# Patient Record
Sex: Male | Born: 1975 | Race: Asian | Hispanic: No | Marital: Married | Smoking: Never smoker
Health system: Southern US, Community
[De-identification: ages and names within clinical notes are randomized; demographics above are authoritative.]

## PROBLEM LIST (undated history)

## (undated) DIAGNOSIS — I10 Essential (primary) hypertension: Secondary | ICD-10-CM

## (undated) DIAGNOSIS — R7303 Prediabetes: Secondary | ICD-10-CM

## (undated) DIAGNOSIS — E119 Type 2 diabetes mellitus without complications: Secondary | ICD-10-CM

## (undated) DIAGNOSIS — A159 Respiratory tuberculosis unspecified: Secondary | ICD-10-CM

## (undated) DIAGNOSIS — E785 Hyperlipidemia, unspecified: Secondary | ICD-10-CM

## (undated) HISTORY — DX: Respiratory tuberculosis unspecified: A15.9

## (undated) HISTORY — DX: Essential (primary) hypertension: I10

## (undated) HISTORY — DX: Hyperlipidemia, unspecified: E78.5

## (undated) HISTORY — DX: Prediabetes: R73.03

## (undated) HISTORY — DX: Type 2 diabetes mellitus without complications: E11.9

---

## 1998-07-21 DIAGNOSIS — A159 Respiratory tuberculosis unspecified: Secondary | ICD-10-CM

## 1998-07-21 HISTORY — DX: Respiratory tuberculosis unspecified: A15.9

## 1998-07-21 HISTORY — PX: THORACENTESIS: SHX235

## 2009-06-30 ENCOUNTER — Emergency Department (HOSPITAL_COMMUNITY): Admission: EM | Admit: 2009-06-30 | Discharge: 2009-06-30 | Payer: Self-pay | Admitting: Family Medicine

## 2010-09-17 ENCOUNTER — Other Ambulatory Visit: Payer: Self-pay | Admitting: Infectious Diseases

## 2010-09-17 ENCOUNTER — Ambulatory Visit
Admission: RE | Admit: 2010-09-17 | Discharge: 2010-09-17 | Disposition: A | Discharge status: Home / Self Care | Payer: No Typology Code available for payment source | Source: Ambulatory Visit | Attending: Infectious Diseases | Admitting: Infectious Diseases

## 2010-09-17 DIAGNOSIS — R9389 Abnormal findings on diagnostic imaging of other specified body structures: Secondary | ICD-10-CM

## 2011-04-10 ENCOUNTER — Ambulatory Visit
Admission: RE | Admit: 2011-04-10 | Discharge: 2011-04-10 | Disposition: A | Discharge status: Home / Self Care | Payer: No Typology Code available for payment source | Source: Ambulatory Visit | Attending: Infectious Diseases | Admitting: Infectious Diseases

## 2011-04-10 ENCOUNTER — Other Ambulatory Visit: Payer: Self-pay | Admitting: Infectious Diseases

## 2011-04-10 DIAGNOSIS — R9389 Abnormal findings on diagnostic imaging of other specified body structures: Secondary | ICD-10-CM

## 2012-02-28 IMAGING — CR DG CHEST 2V
2 series · 2 of 2 positions shown · non-contrast
Comparison: 09/17/2010

CLINICAL DATA: Normal chest x-ray.  Past history of tuberculosis 10
years prior

CHEST - 2 VIEW

[view not recorded (1 of 2)]
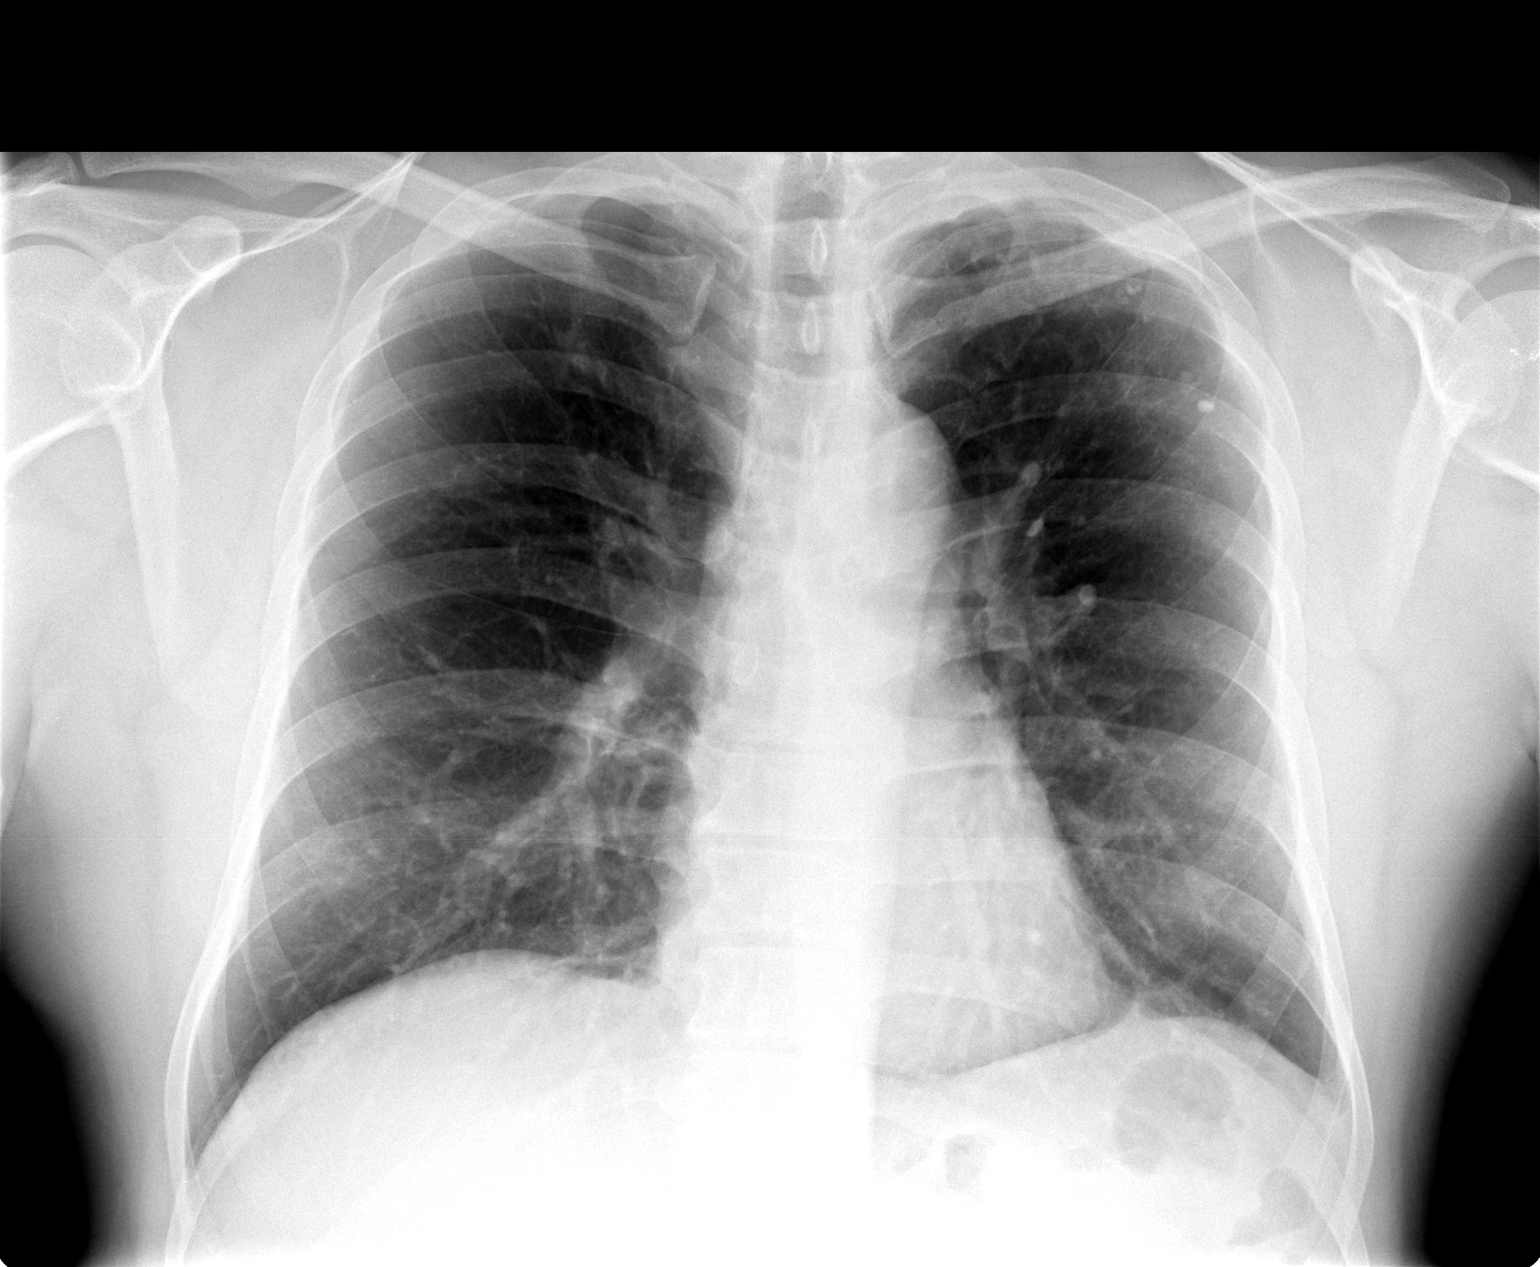

[view not recorded (2 of 2)]
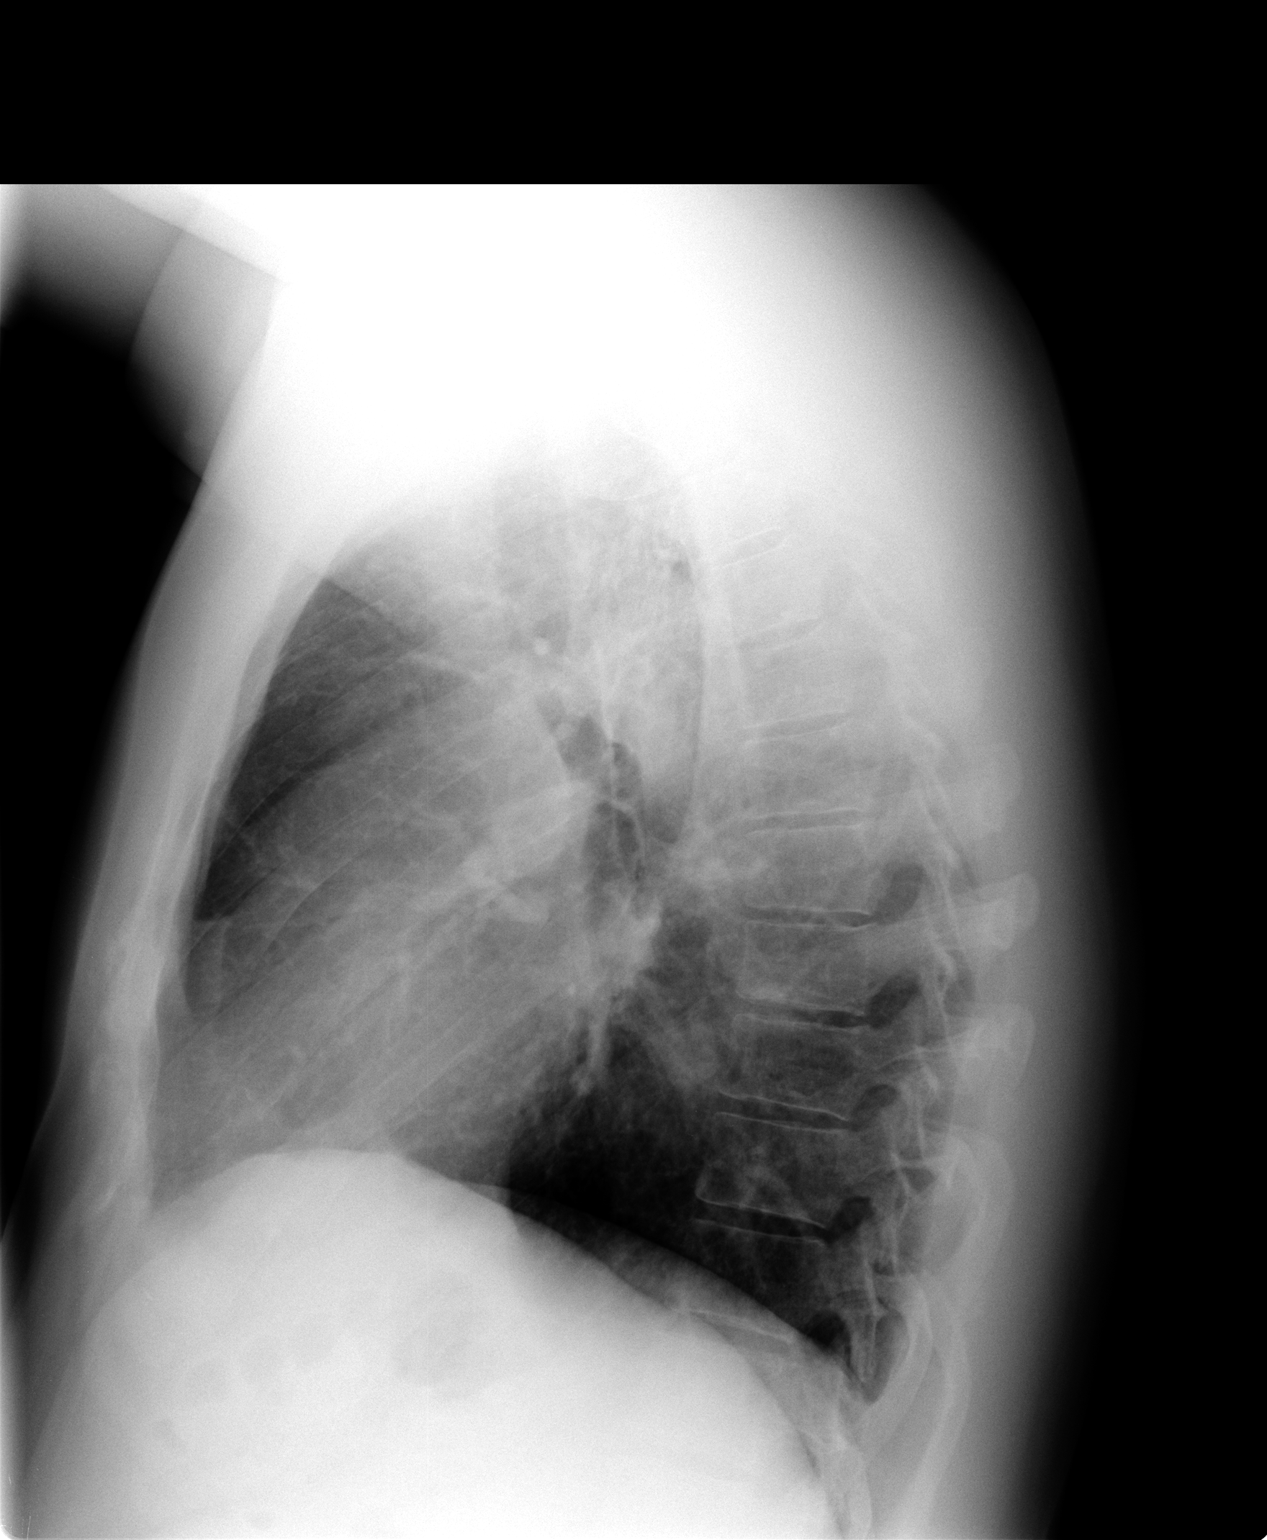

[2 of 2 positions shown; findings below may reference images not displayed]

FINDINGS: Heart and mediastinal contours are stable and within
normal limits.  There is persistent scarring of the left upper lung
zone with calcified granulomata.  This finding is stable in
comparison with the prior exam.  An area of patchy infiltrate noted
in the right midlung zone on the prior exam is no longer apparent.
Some linear scarring of the left pleural surface is seen and is
also stable.

The lung fields demonstrate no new areas of atelectasis or
infiltrate.

Bony structures appear intact.
IMPRESSION: Stable chronic changes of the left hemithorax compatible with
history of prior granulomatous disease.  Area of subtle patchy
infiltrate seen previously has resolved no new worrisome findings
or acute abnormalities noted.

## 2015-03-21 ENCOUNTER — Ambulatory Visit (INDEPENDENT_AMBULATORY_CARE_PROVIDER_SITE_OTHER): Payer: No Typology Code available for payment source | Admitting: Family Medicine

## 2015-03-21 VITALS — BP 138/82 | HR 78 | Temp 97.8°F | Resp 18 | Ht 67.5 in | Wt 211.0 lb

## 2015-03-21 DIAGNOSIS — Z0000 Encounter for general adult medical examination without abnormal findings: Secondary | ICD-10-CM

## 2015-03-21 DIAGNOSIS — Z139 Encounter for screening, unspecified: Secondary | ICD-10-CM | POA: Diagnosis not present

## 2015-03-21 NOTE — Progress Notes (Deleted)
Patient discussed with Mr. Clark. Agree with assessment and plan of care per his note.   

## 2015-03-21 NOTE — Progress Notes (Signed)
03/21/2015 at 9:02 PM  Adrian Alvarez / DOB: 12-01-1975 / MRN: [id number]  The patient  does not have a problem list on file.  SUBJECTIVE  Adrian Alvarez is a 39 y.o. well appearing male presenting for the chief complaint of lab work that was drawn by his company and he has several abnormal values and he and his family are concerned about his health. Those results are scanned into this encounter under media.  He would like to have physical exam tonight.  He reports receiving a tetanus shot 6 years ago upon [place] and he receives the flu shot yearly.  He does not smoke.  He has never been screened for HIV and syphillis.  He feels well and has no complaints today.      He  has no past medical history on file.    Medications reviewed and updated by myself where necessary, and exist elsewhere in the encounter.   Adrian Alvarez has No Known Allergies. He  reports that he has never smoked. He does not have any smokeless tobacco history on file. He reports that he drinks alcohol. He reports that he does not use illicit drugs. He  has no sexual activity history on file. The patient  has no past surgical history on file.  His family history is not on file.  Review of Systems  Constitutional: Negative for fever.  Respiratory: Negative for cough and shortness of breath.   Cardiovascular: Negative for chest pain.  Gastrointestinal: Negative for abdominal pain.  Skin: Negative for rash.  Neurological: Negative for dizziness and headaches.  Psychiatric/Behavioral: Negative for depression.    OBJECTIVE  His  height is 5' 7.5" (1.715 m) and weight is 211 lb (95.709 kg). His oral temperature is 97.8 F (36.6 C). His blood pressure is 148/86 and his pulse is 78. His respiration is 18 and oxygen saturation is 99%.  The patient's body mass index is 32.54 kg/(m^2).  Physical Exam  Vitals reviewed. Constitutional: He is oriented to person, place, and time. He appears well-developed. No distress.  Eyes:  EOM are normal. Pupils are equal, round, and reactive to light. No scleral icterus.  Neck: Normal range of motion.  Cardiovascular: Normal rate and regular rhythm.   Respiratory: Effort normal and breath sounds normal.  GI: Soft. Bowel sounds are normal. He exhibits no distension and no mass. There is no tenderness. There is no rebound and no guarding.  Musculoskeletal: Normal range of motion.  Neurological: He is alert and oriented to person, place, and time. No cranial nerve deficit.  Skin: Skin is warm and dry. No rash noted. He is not diaphoretic.  Psychiatric: He has a normal mood and affect. His behavior is normal. Judgment and thought content normal.    No results found for this or any previous visit (from the past 24 hour(s)).  ASSESSMENT & PLAN  Adrian Alvarez was seen today for abnormal lab.  Diagnoses and all orders for this visit:  Annual physical exam: His tetanus is up to date per his HPI.  He receives the flu shot yearly.  He has been advised to lose weight through diet modification and maintaining his current level of physical activity.   Screening -     TSH -     CBC -     COMPLETE METABOLIC PANEL WITH GFR -     Lipid panel -     HIV antibody (with reflex) -     RPR -     Hemoglobin A1c  The patient was advised to call or come back to clinic if he does not see an improvement in symptoms, or worsens with the above plan.   Deliah Boston, MHS, PA-C [facility] 03/21/2015 9:02 PM

## 2015-03-22 LAB — COMPLETE METABOLIC PANEL WITH GFR
ALBUMIN: 4.6 g/dL (ref 3.6–5.1)
ALK PHOS: 66 U/L (ref 40–115)
ALT: 61 U/L — ABNORMAL HIGH (ref 9–46)
AST: 32 U/L (ref 10–40)
BUN: 14 mg/dL (ref 7–25)
CALCIUM: 9.6 mg/dL (ref 8.6–10.3)
CO2: 26 mmol/L (ref 20–31)
Chloride: 102 mmol/L (ref 98–110)
Creat: 0.93 mg/dL (ref 0.60–1.35)
Glucose, Bld: 78 mg/dL (ref 65–99)
POTASSIUM: 3.9 mmol/L (ref 3.5–5.3)
Sodium: 138 mmol/L (ref 135–146)
Total Bilirubin: 0.5 mg/dL (ref 0.2–1.2)
Total Protein: 7.4 g/dL (ref 6.1–8.1)

## 2015-03-22 LAB — HIV ANTIBODY (ROUTINE TESTING W REFLEX): HIV 1&2 Ab, 4th Generation: NONREACTIVE

## 2015-03-22 LAB — CBC
HEMATOCRIT: 43.8 % (ref 39.0–52.0)
HEMOGLOBIN: 14.8 g/dL (ref 13.0–17.0)
MCH: 29.3 pg (ref 26.0–34.0)
MCHC: 33.8 g/dL (ref 30.0–36.0)
MCV: 86.7 fL (ref 78.0–100.0)
MPV: 9.5 fL (ref 8.6–12.4)
Platelets: 321 10*3/uL (ref 150–400)
RBC: 5.05 MIL/uL (ref 4.22–5.81)
RDW: 13.9 % (ref 11.5–15.5)
WBC: 9.3 10*3/uL (ref 4.0–10.5)

## 2015-03-22 LAB — LIPID PANEL
CHOL/HDL RATIO: 6.8 ratio — AB (ref <=5.0)
CHOLESTEROL: 237 mg/dL — AB (ref 125–200)
HDL: 35 mg/dL — ABNORMAL LOW (ref >=40)
LDL Cholesterol: 142 mg/dL — ABNORMAL HIGH (ref <130)
TRIGLYCERIDES: 302 mg/dL — AB (ref <150)
VLDL: 60 mg/dL — ABNORMAL HIGH (ref <30)

## 2015-03-22 LAB — RPR

## 2015-03-22 LAB — TSH: TSH: 1.566 u[IU]/mL (ref 0.350–4.500)

## 2015-03-22 LAB — HEMOGLOBIN A1C
HEMOGLOBIN A1C: 5.8 % — AB (ref <5.7)
MEAN PLASMA GLUCOSE: 120 mg/dL — AB (ref <117)

## 2015-03-24 ENCOUNTER — Encounter: Payer: Self-pay | Admitting: Family Medicine

## 2015-04-11 NOTE — Progress Notes (Signed)
Patient discussed with  Mr. Chestine Spore. Agree with assessment and plan of care per  his note.   Screening lab work for physical performed as below.

## 2016-04-29 ENCOUNTER — Other Ambulatory Visit: Payer: Self-pay | Admitting: Family Medicine

## 2016-04-30 LAB — CMP12+LP+TP+TSH+6AC+PSA+CBC…
ALT: 66 IU/L — AB (ref 0–44)
AST: 32 IU/L (ref 0–40)
Albumin/Globulin Ratio: 1.7 (ref 1.2–2.2)
Albumin: 4.9 g/dL (ref 3.5–5.5)
Alkaline Phosphatase: 77 IU/L (ref 39–117)
BASOS: 1 %
BUN / CREAT RATIO: 15 (ref 9–20)
BUN: 11 mg/dL (ref 6–24)
Basophils Absolute: 0.1 10*3/uL (ref 0.0–0.2)
Bilirubin Total: 0.4 mg/dL (ref 0.0–1.2)
CALCIUM: 9.8 mg/dL (ref 8.7–10.2)
CHLORIDE: 103 mmol/L (ref 96–106)
CHOL/HDL RATIO: 5.6 ratio — AB (ref 0.0–5.0)
CREATININE: 0.71 mg/dL — AB (ref 0.76–1.27)
Cholesterol, Total: 237 mg/dL — ABNORMAL HIGH (ref 100–199)
EOS (ABSOLUTE): 0.2 10*3/uL (ref 0.0–0.4)
EOS: 2 %
ESTIMATED CHD RISK: 1.2 times avg. — AB (ref 0.0–1.0)
Free Thyroxine Index: 1.8 (ref 1.2–4.9)
GFR calc Af Amer: 134 mL/min/{1.73_m2} (ref >59)
GFR, EST NON AFRICAN AMERICAN: 116 mL/min/{1.73_m2} (ref >59)
GGT: 88 IU/L — AB (ref 0–65)
GLOBULIN, TOTAL: 2.9 g/dL (ref 1.5–4.5)
Glucose: 100 mg/dL — ABNORMAL HIGH (ref 65–99)
HDL: 42 mg/dL (ref >39)
HEMATOCRIT: 46.3 % (ref 37.5–51.0)
HEMOGLOBIN: 15.9 g/dL (ref 12.6–17.7)
Immature Grans (Abs): 0 10*3/uL (ref 0.0–0.1)
Immature Granulocytes: 0 %
Iron: 122 ug/dL (ref 38–169)
LDH: 215 IU/L (ref 121–224)
LDL Calculated: 161 mg/dL — ABNORMAL HIGH (ref 0–99)
LYMPHS ABS: 3.6 10*3/uL — AB (ref 0.7–3.1)
Lymphs: 40 %
MCH: 30.3 pg (ref 26.6–33.0)
MCHC: 34.3 g/dL (ref 31.5–35.7)
MCV: 88 fL (ref 79–97)
MONOS ABS: 0.5 10*3/uL (ref 0.1–0.9)
Monocytes: 5 %
NEUTROS ABS: 4.6 10*3/uL (ref 1.4–7.0)
Neutrophils: 52 %
PHOSPHORUS: 3.5 mg/dL (ref 2.5–4.5)
POTASSIUM: 4.5 mmol/L (ref 3.5–5.2)
PROSTATE SPECIFIC AG, SERUM: 2.3 ng/mL (ref 0.0–4.0)
Platelets: 308 10*3/uL (ref 150–379)
RBC: 5.24 x10E6/uL (ref 4.14–5.80)
RDW: 13.9 % (ref 12.3–15.4)
SODIUM: 139 mmol/L (ref 134–144)
T3 Uptake Ratio: 24 % (ref 24–39)
T4 TOTAL: 7.3 ug/dL (ref 4.5–12.0)
TRIGLYCERIDES: 172 mg/dL — AB (ref 0–149)
TSH: 1.49 u[IU]/mL (ref 0.450–4.500)
Total Protein: 7.8 g/dL (ref 6.0–8.5)
Uric Acid: 7.2 mg/dL (ref 3.7–8.6)
VLDL Cholesterol Cal: 34 mg/dL (ref 5–40)
WBC: 9.1 10*3/uL (ref 3.4–10.8)

## 2016-04-30 LAB — HGB A1C W/O EAG: Hgb A1c MFr Bld: 6 % — ABNORMAL HIGH (ref 4.8–5.6)

## 2017-04-23 ENCOUNTER — Encounter: Payer: No Typology Code available for payment source | Admitting: Physician Assistant

## 2017-05-01 ENCOUNTER — Ambulatory Visit: Payer: Self-pay | Admitting: *Deleted

## 2017-05-01 ENCOUNTER — Other Ambulatory Visit: Payer: Self-pay | Admitting: Family Medicine

## 2017-05-01 VITALS — BP 146/100 | Ht 66.5 in | Wt 241.0 lb

## 2017-05-01 DIAGNOSIS — Z0000 Encounter for general adult medical examination without abnormal findings: Secondary | ICD-10-CM

## 2017-05-01 NOTE — Progress Notes (Signed)
[employer] insurance premium discount evaluation: Labs Drawn. Replacements ROI form signed. Tobacco Free Attestation form signed.  Forms placed in paper chart.  Labs on manual requisition due to power outage.   Okay to route results to pcp per pt.       

## 2017-05-03 LAB — CMP12+LP+TP+TSH+6AC+PSA+CBC…
A/G RATIO: 1.6 (ref 1.2–2.2)
ALBUMIN: 4.6 g/dL (ref 3.5–5.5)
ALK PHOS: 79 IU/L (ref 39–117)
ALT: 79 IU/L — AB (ref 0–44)
AST: 40 IU/L (ref 0–40)
BASOS ABS: 0.1 10*3/uL (ref 0.0–0.2)
BILIRUBIN TOTAL: 0.5 mg/dL (ref 0.0–1.2)
BUN/Creatinine Ratio: 13 (ref 9–20)
BUN: 11 mg/dL (ref 6–24)
Basos: 1 %
CHOL/HDL RATIO: 6 ratio — AB (ref 0.0–5.0)
CHOLESTEROL TOTAL: 223 mg/dL — AB (ref 100–199)
Calcium: 9.5 mg/dL (ref 8.7–10.2)
Chloride: 102 mmol/L (ref 96–106)
Creatinine, Ser: 0.82 mg/dL (ref 0.76–1.27)
EOS (ABSOLUTE): 0.2 10*3/uL (ref 0.0–0.4)
EOS: 2 %
Estimated CHD Risk: 1.3 times avg. — ABNORMAL HIGH (ref 0.0–1.0)
FREE THYROXINE INDEX: 1.6 (ref 1.2–4.9)
GFR calc Af Amer: 125 mL/min/{1.73_m2} (ref >59)
GFR calc non Af Amer: 108 mL/min/{1.73_m2} (ref >59)
GGT: 74 IU/L — ABNORMAL HIGH (ref 0–65)
GLOBULIN, TOTAL: 2.9 g/dL (ref 1.5–4.5)
Glucose: 104 mg/dL — ABNORMAL HIGH (ref 65–99)
HDL: 37 mg/dL — ABNORMAL LOW (ref >39)
HEMOGLOBIN: 15.7 g/dL (ref 13.0–17.7)
Hematocrit: 46.1 % (ref 37.5–51.0)
IMMATURE GRANS (ABS): 0 10*3/uL (ref 0.0–0.1)
IMMATURE GRANULOCYTES: 0 %
Iron: 138 ug/dL (ref 38–169)
LDH: 249 IU/L — AB (ref 121–224)
LDL CALC: 154 mg/dL — AB (ref 0–99)
LYMPHS ABS: 3.9 10*3/uL — AB (ref 0.7–3.1)
LYMPHS: 44 %
MCH: 30.7 pg (ref 26.6–33.0)
MCHC: 34.1 g/dL (ref 31.5–35.7)
MCV: 90 fL (ref 79–97)
MONOS ABS: 0.4 10*3/uL (ref 0.1–0.9)
Monocytes: 5 %
NEUTROS PCT: 48 %
Neutrophils Absolute: 4.4 10*3/uL (ref 1.4–7.0)
PLATELETS: 316 10*3/uL (ref 150–379)
PROSTATE SPECIFIC AG, SERUM: 2.2 ng/mL (ref 0.0–4.0)
Phosphorus: 3.6 mg/dL (ref 2.5–4.5)
Potassium: 4.4 mmol/L (ref 3.5–5.2)
RBC: 5.12 x10E6/uL (ref 4.14–5.80)
RDW: 13.7 % (ref 12.3–15.4)
Sodium: 140 mmol/L (ref 134–144)
T3 Uptake Ratio: 23 % — ABNORMAL LOW (ref 24–39)
T4, Total: 7.1 ug/dL (ref 4.5–12.0)
TSH: 1.65 u[IU]/mL (ref 0.450–4.500)
Total Protein: 7.5 g/dL (ref 6.0–8.5)
Triglycerides: 159 mg/dL — ABNORMAL HIGH (ref 0–149)
Uric Acid: 8.1 mg/dL (ref 3.7–8.6)
VLDL Cholesterol Cal: 32 mg/dL (ref 5–40)
WBC: 9 10*3/uL (ref 3.4–10.8)

## 2017-05-03 LAB — HGB A1C W/O EAG: Hgb A1c MFr Bld: 6.3 % — ABNORMAL HIGH (ref 4.8–5.6)

## 2017-05-04 NOTE — Progress Notes (Signed)
Results to be scanned in under media tab. Done on manual requisition and did not cross over to EPIC.   Results reviewed with pt. Glucose and A1c elevated, worsened from previous. LDH, ALT and GGT elevated. Pt denies etoh use, otc supplement use, and multivitamin occasionally when her remembers to take it. Lipids elevated, worse than previous. T3 uptake low.   Diet and exercise recommendations for lipids, A1c, wt management (BMI 38) discussed. Pt fails 2/3 for [employer] insurance premium discount. Scheduled to see pcp on 10/18. Given alternative form to have pcp complete for program. Pt verbalizes understanding and agreement.  Routine f/u with pcp. Copy of labs unable to be provided to pt or routed to pcp due to printer issues. Will scan in and then route to pcp. Pt may pick up a copy in clinic if desired, later in week. No further questions/concerns.

## 2017-05-05 NOTE — Addendum Note (Signed)
Addended by: Loraine Grip on: 05/05/2017 10:00 AM   Modules accepted: Orders

## 2017-05-05 NOTE — Progress Notes (Signed)
Lab results scanned in under orders.

## 2017-05-07 ENCOUNTER — Ambulatory Visit (INDEPENDENT_AMBULATORY_CARE_PROVIDER_SITE_OTHER): Payer: No Typology Code available for payment source | Admitting: Family Medicine

## 2017-05-07 ENCOUNTER — Encounter: Payer: Self-pay | Admitting: Family Medicine

## 2017-05-07 VITALS — BP 139/95 | HR 82 | Temp 97.5°F | Resp 20 | Ht 67.32 in | Wt 228.0 lb

## 2017-05-07 DIAGNOSIS — R945 Abnormal results of liver function studies: Secondary | ICD-10-CM

## 2017-05-07 DIAGNOSIS — R7303 Prediabetes: Secondary | ICD-10-CM | POA: Diagnosis not present

## 2017-05-07 DIAGNOSIS — E782 Mixed hyperlipidemia: Secondary | ICD-10-CM

## 2017-05-07 DIAGNOSIS — Z113 Encounter for screening for infections with a predominantly sexual mode of transmission: Secondary | ICD-10-CM

## 2017-05-07 DIAGNOSIS — R7989 Other specified abnormal findings of blood chemistry: Secondary | ICD-10-CM

## 2017-05-07 DIAGNOSIS — E669 Obesity, unspecified: Secondary | ICD-10-CM

## 2017-05-07 DIAGNOSIS — Z0000 Encounter for general adult medical examination without abnormal findings: Secondary | ICD-10-CM | POA: Diagnosis not present

## 2017-05-07 DIAGNOSIS — Z6835 Body mass index (BMI) 35.0-35.9, adult: Secondary | ICD-10-CM

## 2017-05-07 DIAGNOSIS — R030 Elevated blood-pressure reading, without diagnosis of hypertension: Secondary | ICD-10-CM

## 2017-05-07 NOTE — Patient Instructions (Addendum)
Work on diet as listed below, exercise 30-40 minutes for 4 to 5 days per week. Return to see me in 3 months for repeat cholesterol, diabetes test, liver test and to recheck blood pressure.   Please bring copy of immunizations if possible to next visit. If more than 10 years since tetanus, then recommend that vaccine.   Return to the clinic or go to the nearest emergency room if any of your symptoms worsen or new symptoms occur.  Prediabetes Eating Plan Prediabetes-also called impaired glucose tolerance or impaired fasting glucose-is a condition that causes blood sugar (blood glucose) levels to be higher than normal. Following a healthy diet can help to keep prediabetes under control. It can also help to lower the risk of type 2 diabetes and heart disease, which are increased in people who have prediabetes. Along with regular exercise, a healthy diet:  Promotes weight loss.  Helps to control blood sugar levels.  Helps to improve the way that the body uses insulin.  What do I need to know about this eating plan?  Use the glycemic index (GI) to plan your meals. The index tells you how quickly a food will raise your blood sugar. Choose low-GI foods. These foods take a longer time to raise blood sugar.  Pay close attention to the amount of carbohydrates in the food that you eat. Carbohydrates increase blood sugar levels.  Keep track of how many calories you take in. Eating the right amount of calories will help you to achieve a healthy weight. Losing about 7 percent of your starting weight can help to prevent type 2 diabetes.  You may want to follow a Mediterranean diet. This diet includes a lot of vegetables, lean meats or fish, whole grains, fruits, and healthy oils and fats. What foods can I eat? Grains Whole grains, such as whole-wheat or whole-grain breads, crackers, cereals, and pasta. Unsweetened oatmeal. Bulgur. Barley. Quinoa. Brown rice. Corn or whole-wheat flour tortillas or taco  shells. Vegetables Lettuce. Spinach. Peas. Beets. Cauliflower. Cabbage. Broccoli. Carrots. Tomatoes. Squash. Eggplant. Herbs. Peppers. Onions. Cucumbers. Brussels sprouts. Fruits Berries. Bananas. Apples. Oranges. Grapes. Papaya. Mango. Pomegranate. Kiwi. Grapefruit. Cherries. Meats and Other Protein Sources Seafood. Lean meats, such as chicken and [place] or lean cuts of pork and beef. Tofu. Eggs. Nuts. Beans. Dairy Low-fat or fat-free dairy products, such as yogurt, cottage cheese, and cheese. Beverages Water. Tea. Coffee. Sugar-free or diet soda. Seltzer water. Milk. Milk alternatives, such as soy or almond milk. Condiments Mustard. Relish. Low-fat, low-sugar ketchup. Low-fat, low-sugar barbecue sauce. Low-fat or fat-free mayonnaise. Sweets and Desserts Sugar-free or low-fat pudding. Sugar-free or low-fat ice cream and other frozen treats. Fats and Oils Avocado. Walnuts. Olive oil. The items listed above may not be a complete list of recommended foods or beverages. Contact your dietitian for more options. What foods are not recommended? Grains Refined white flour and flour products, such as bread, pasta, snack foods, and cereals. Beverages Sweetened drinks, such as sweet iced tea and soda. Sweets and Desserts Baked goods, such as cake, cupcakes, pastries, cookies, and cheesecake. The items listed above may not be a complete list of foods and beverages to avoid. Contact your dietitian for more information. This information is not intended to replace advice given to you by your health care provider. Make sure you discuss any questions you have with your health care provider. Document Released: 11/21/2014 Document Revised: 12/13/2015 Document Reviewed: 08/02/2014 Elsevier Interactive Patient Education  2017 [employer]   Keeping you healthy  Get these tests  Blood pressure- Have your blood pressure checked once a year by your healthcare provider.  Normal blood pressure is  120/80.  Weight- Have your body mass index (BMI) calculated to screen for obesity.  BMI is a measure of body fat based on height and weight. You can also calculate your own BMI at https://www.west-esparza.com/.  Cholesterol- Have your cholesterol checked regularly starting at age 8, sooner may be necessary if you have diabetes, high blood pressure, if a family member developed heart diseases at an early age or if you smoke.   Chlamydia, HIV, and other sexual transmitted disease- Get screened each year until the age of 37 then within three months of each new sexual partner.  Diabetes- Have your blood sugar checked regularly if you have high blood pressure, high cholesterol, a family history of diabetes or if you are overweight.  Get these vaccines  Flu shot- Every fall.  Tetanus shot- Every 10 years.  Menactra- Single dose; prevents meningitis.  Take these steps  Don't smoke- If you do smoke, ask your healthcare provider about quitting. For tips on how to quit, go to www.smokefree.gov or call 1-800-QUIT-NOW.  Be physically active- Exercise 5 days a week for at least 30 minutes.  If you are not already physically active start slow and gradually work up to 30 minutes of moderate physical activity.  Examples of moderate activity include walking briskly, mowing the yard, dancing, swimming bicycling, etc.  Eat a healthy diet- Eat a variety of healthy foods such as fruits, vegetables, low fat milk, low fat cheese, yogurt, lean meats, poultry, fish, beans, tofu, etc.  For more information on healthy eating, go to www.thenutritionsource.org  Drink alcohol in moderation- Limit alcohol intake two drinks or less a day.  Never drink and drive.  Dentist- Brush and floss teeth twice daily; visit your dentis twice a year.  Depression-Your emotional health is as important as your physical health.  If you're feeling down, losing interest in things you normally enjoy please talk with your healthcare  provider.  Gun Safety- If you keep a gun in your home, keep it unloaded and with the safety lock on.  Bullets should be stored separately.  Helmet use- Always wear a helmet when riding a motorcycle, bicycle, rollerblading or skateboarding.  Safe sex- If you may be exposed to a sexually transmitted infection, use a condom  Seat belts- Seat bels can save your life; always wear one.  Smoke/Carbon Monoxide detectors- These detectors need to be installed on the appropriate level of your home.  Replace batteries at least once a year.  Skin Cancer- When out in the sun, cover up and use sunscreen SPF 15 or higher.  Violence- If anyone is threatening or hurting you, please tell your healthcare provider.    IF you received an x-ray today, you will receive an invoice from [facility]. Please contact [facility] at [phone] with questions or concerns regarding your invoice.   IF you received labwork today, you will receive an invoice from [facility]. Please contact [facility] at [phone] with questions or concerns regarding your invoice.   Our billing staff will not be able to assist you with questions regarding bills from these companies.  You will be contacted with the lab results as soon as they are available. The fastest way to get your results is to activate your My Chart account. Instructions are located on the last page of this paperwork. If you have not heard from [place] regarding the  results in 2 weeks, please contact this office.

## 2017-05-07 NOTE — Progress Notes (Signed)
Subjective:  By signing my name below, I, Stann Oresung-Kai Tsai, attest that this documentation has been prepared under the direction and in the presence of Meredith StaggersJeffrey Faolan [place], MD. Electronically Signed: Stann Oresung-Kai Tsai, Scribe. 05/07/2017 , 3:44 PM .  Patient was seen in Room 10 .   Patient ID: Adrian Alvarez, male    DOB: 10/31/1975, 41 y.o.   MRN: [id number] Chief Complaint  Patient presents with  . Annual Exam   HPI Adrian Alvarez is a 41 y.o. male Here for annual physical. He also presents with forms for reasonable alternative for his insurance program, as he did not meet the standards for his 2019 wellness program, which includes A1C <7.0, BP <140/90, LDL <130, and meeting 2 of 3 of these criteria. He failed lipids and BP criteria.   He was last seen by Deliah BostonMichael Clark, PA-C, in Aug 2016 for annual physical. Weight loss was discussed at that time, through diet and physical activity. His weight was 211 lbs and BP was 148/86.   [place]   Cancer Screening No listed early family history of cancer. His PSA was 2.2 on labs scanned from Oct 12th 2018.   Immunizations Immunization History  Administered Date(s) Administered  . Influenza,inj,Quad PF,6+ Mos 05/16/2016   Tdap: reports tetanus received approximately 8 years ago upon immigration.   Depression [facility] 05/07/2017 03/21/2015  Decreased Interest 0 0  Down, Depressed, Hopeless 0 0  PHQ - 2 Score 0 0    Vision  Visual Acuity Screening   Right eye Left eye Both eyes  Without correction: 20/25 20/40 20/20   With correction:      He denies having an eye doctor.   Dentist He denies seeing a dentist, due to work schedule.   Exercise/diet/obesity He states rare exercise due to working. He eats fast food occasionally with occasional soda. He denies chest pain or shortness of breath with activity. He drinks occasional alcoholic beverage, about 1-2 bottles a week. He denies history of  smoking.   Wt Readings from Last 3 Encounters:  05/07/17 228 lb (103.4 kg)  05/01/17 241 lb (109.3 kg)  03/21/15 211 lb (95.7 kg)   Body mass index is 35.37 kg/m.  STI screening He mentions having STI in the past, a long time ago, but doesn't recall what it was. He denies any testing recent.   Pre-diabetes His A1C was 5.8 at 2 years ago, then 6.0 in Oct 2017. Scanned paperwork from [facility] reviewed from 05/01/17 with A1C of 6.3.   His CBC was overall normal and glucose at 104. ALT slightly elevated at 79, GGT slightly elevated at 74, and LDH slightly elevated at 249.   Hyperlipidemia Scanned [facility] from [facility] reviewed from 05/01/17.  Total cholesterol: 233 Triglycerides: 159 HDL: 37 LDL 154  TSH: normal.   There are no active problems to display for this patient.  History reviewed. No pertinent past medical history. History reviewed. No pertinent surgical history. No Known Allergies Prior to Admission medications   Not on File   Social History   Social History  . Marital status: Single    Spouse name: N/A  . Number of children: N/A  . Years of education: N/A   Occupational History  . Not on file.   Social History Main Topics  . Smoking status: Never Smoker  . Smokeless tobacco: Never Used  . Alcohol use 0.0 oz/week  . Drug use: No  . Sexual activity: Not on file  Other Topics Concern  . Not on file   Social History Narrative  . No narrative on file   Review of Systems 13 point ROS - negative     Objective:   Physical Exam  Constitutional: He is oriented to person, place, and time. He appears well-developed and well-nourished.  HENT:  Head: Normocephalic and atraumatic.  Right Ear: External ear normal.  Left Ear: External ear normal.  Mouth/Throat: Oropharynx is clear and moist.  Eyes: Pupils are equal, round, and reactive to light. Conjunctivae and EOM are normal.  Neck: Normal range of motion. Neck supple. No thyromegaly present.    Cardiovascular: Normal rate, regular rhythm, normal heart sounds and intact distal pulses.   Pulmonary/Chest: Effort normal and breath sounds normal. No respiratory distress. He has no wheezes.  Abdominal: Soft. He exhibits no distension. There is no tenderness. Hernia confirmed negative in the right inguinal area and confirmed negative in the left inguinal area.  Genitourinary: Prostate normal.  Musculoskeletal: Normal range of motion. He exhibits no edema or tenderness.  Lymphadenopathy:    He has no cervical adenopathy.  Neurological: He is alert and oriented to person, place, and time. He has normal reflexes.  Skin: Skin is warm and dry.  Psychiatric: He has a normal mood and affect. His behavior is normal.  Vitals reviewed.   Vitals:   05/07/17 1501  BP: (!) 142/94  Pulse: 82  Resp: 20  Temp: (!) 97.5 F (36.4 C)  TempSrc: Oral  SpO2: 98%  Weight: 228 lb (103.4 kg)  Height: 5' 7.32" (1.71 m)   Body mass index is 35.37 kg/m.     Assessment & Plan:   Adrian Alvarez is a 41 y.o. male Annual physical exam - Plan: GC/Chlamydia Probe Amp, RPR, HIV antibody  - -anticipatory guidance as below in AVS, screening labs above. Health maintenance items as above in HPI discussed/recommended as applicable.   Class 2 obesity with body mass index (BMI) of 35.0 to 35.9 in adult, unspecified obesity type, unspecified whether serious comorbidity present Prediabetes  - increasing A1C over time. Close to diabetes at this point. Discussed potential for diabetes diagnosis and meds if he does not change diet, weight.   -prediabetes diet and exercise goals discussed with recheck in 3 months.   Mixed hyperlipidemia  - as above. Will approach diet, wt loss with exercise initially.    Routine screening for STI (sexually transmitted infection) - Plan: GC/Chlamydia Probe Amp, RPR, HIV antibody  Elevated liver function tests  - may also be related to obesity. Diet, exercise for weight loss, then  recheck levels in 3 months.   Elevated BP - borderline. Hold on meds at present. Consider Rx if still elevated in 3 months.   Form completed for work for screening labs and elevation exemption with goals of improved numbers in 3 months as above.   No orders of the defined types were placed in this encounter.  Patient Instructions   Work on diet as listed below, exercise 30-40 minutes for 4 to 5 days per week. Return to see me in 3 months for repeat cholesterol, diabetes test, liver test and to recheck blood pressure.   Please bring copy of immunizations if possible to next visit. If more than 10 years since tetanus, then recommend that vaccine.   Return to the clinic or go to the nearest emergency room if any of your symptoms worsen or new symptoms occur.  Prediabetes Eating Plan Prediabetes-also called impaired glucose tolerance or impaired  fasting glucose-is a condition that causes blood sugar (blood glucose) levels to be higher than normal. Following a healthy diet can help to keep prediabetes under control. It can also help to lower the risk of type 2 diabetes and heart disease, which are increased in people who have prediabetes. Along with regular exercise, a healthy diet:  Promotes weight loss.  Helps to control blood sugar levels.  Helps to improve the way that the body uses insulin.  What do I need to know about this eating plan?  Use the glycemic index (GI) to plan your meals. The index tells you how quickly a food will raise your blood sugar. Choose low-GI foods. These foods take a longer time to raise blood sugar.  Pay close attention to the amount of carbohydrates in the food that you eat. Carbohydrates increase blood sugar levels.  Keep track of how many calories you take in. Eating the right amount of calories will help you to achieve a healthy weight. Losing about 7 percent of your starting weight can help to prevent type 2 diabetes.  You may want to follow a  Mediterranean diet. This diet includes a lot of vegetables, lean meats or fish, whole grains, fruits, and healthy oils and fats. What foods can I eat? Grains Whole grains, such as whole-wheat or whole-grain breads, crackers, cereals, and pasta. Unsweetened oatmeal. Bulgur. Barley. Quinoa. Brown rice. Corn or whole-wheat flour tortillas or taco shells. Vegetables Lettuce. Spinach. Peas. Beets. Cauliflower. Cabbage. Broccoli. Carrots. Tomatoes. Squash. Eggplant. Herbs. Peppers. Onions. Cucumbers. Brussels sprouts. Fruits Berries. Bananas. Apples. Oranges. Grapes. Papaya. Mango. Pomegranate. Kiwi. Grapefruit. Cherries. Meats and Other Protein Sources Seafood. Lean meats, such as chicken and [place] or lean cuts of pork and beef. Tofu. Eggs. Nuts. Beans. Dairy Low-fat or fat-free dairy products, such as yogurt, cottage cheese, and cheese. Beverages Water. Tea. Coffee. Sugar-free or diet soda. Seltzer water. Milk. Milk alternatives, such as soy or almond milk. Condiments Mustard. Relish. Low-fat, low-sugar ketchup. Low-fat, low-sugar barbecue sauce. Low-fat or fat-free mayonnaise. Sweets and Desserts Sugar-free or low-fat pudding. Sugar-free or low-fat ice cream and other frozen treats. Fats and Oils Avocado. Walnuts. Olive oil. The items listed above may not be a complete list of recommended foods or beverages. Contact your dietitian for more options. What foods are not recommended? Grains Refined white flour and flour products, such as bread, pasta, snack foods, and cereals. Beverages Sweetened drinks, such as sweet iced tea and soda. Sweets and Desserts Baked goods, such as cake, cupcakes, pastries, cookies, and cheesecake. The items listed above may not be a complete list of foods and beverages to avoid. Contact your dietitian for more information. This information is not intended to replace advice given to you by your health care provider. Make sure you discuss any questions you have with  your health care provider. Document Released: 11/21/2014 Document Revised: 12/13/2015 Document Reviewed: 08/02/2014 Elsevier Interactive Patient Education  2017 [employer].   Keeping you healthy  Get these tests  Blood pressure- Have your blood pressure checked once a year by your healthcare provider.  Normal blood pressure is 120/80.  Weight- Have your body mass index (BMI) calculated to screen for obesity.  BMI is a measure of body fat based on height and weight. You can also calculate your own BMI at https://www.west-esparza.com/.  Cholesterol- Have your cholesterol checked regularly starting at age 73, sooner may be necessary if you have diabetes, high blood pressure, if a family member developed heart diseases at an early  age or if you smoke.   Chlamydia, HIV, and other sexual transmitted disease- Get screened each year until the age of 28 then within three months of each new sexual partner.  Diabetes- Have your blood sugar checked regularly if you have high blood pressure, high cholesterol, a family history of diabetes or if you are overweight.  Get these vaccines  Flu shot- Every fall.  Tetanus shot- Every 10 years.  Menactra- Single dose; prevents meningitis.  Take these steps  Don't smoke- If you do smoke, ask your healthcare provider about quitting. For tips on how to quit, go to www.smokefree.gov or call 1-800-QUIT-NOW.  Be physically active- Exercise 5 days a week for at least 30 minutes.  If you are not already physically active start slow and gradually work up to 30 minutes of moderate physical activity.  Examples of moderate activity include walking briskly, mowing the yard, dancing, swimming bicycling, etc.  Eat a healthy diet- Eat a variety of healthy foods such as fruits, vegetables, low fat milk, low fat cheese, yogurt, lean meats, poultry, fish, beans, tofu, etc.  For more information on healthy eating, go to www.thenutritionsource.org  Drink alcohol in  moderation- Limit alcohol intake two drinks or less a day.  Never drink and drive.  Dentist- Brush and floss teeth twice daily; visit your dentis twice a year.  Depression-Your emotional health is as important as your physical health.  If you're feeling down, losing interest in things you normally enjoy please talk with your healthcare provider.  Gun Safety- If you keep a gun in your home, keep it unloaded and with the safety lock on.  Bullets should be stored separately.  Helmet use- Always wear a helmet when riding a motorcycle, bicycle, rollerblading or skateboarding.  Safe sex- If you may be exposed to a sexually transmitted infection, use a condom  Seat belts- Seat bels can save your life; always wear one.  Smoke/Carbon Monoxide detectors- These detectors need to be installed on the appropriate level of your home.  Replace batteries at least once a year.  Skin Cancer- When out in the sun, cover up and use sunscreen SPF 15 or higher.  Violence- If anyone is threatening or hurting you, please tell your healthcare provider.    IF you received an x-ray today, you will receive an invoice from [facility]. Please contact [facility] at [phone] with questions or concerns regarding your invoice.   IF you received labwork today, you will receive an invoice from [facility]. Please contact [facility] at [phone] with questions or concerns regarding your invoice.   Our billing staff will not be able to assist you with questions regarding bills from these companies.  You will be contacted with the lab results as soon as they are available. The fastest way to get your results is to activate your My Chart account. Instructions are located on the last page of this paperwork. If you have not heard from [place] regarding the results in 2 weeks, please contact this office.      I personally performed the services described in this documentation, which was scribed in my presence.  The recorded information has been reviewed and considered for accuracy and completeness, addended by me as needed, and agree with information above.  Signed,   Meredith Staggers, MD Primary Care at [facility].  05/07/17 4:04 PM

## 2017-05-08 LAB — HIV ANTIBODY (ROUTINE TESTING W REFLEX): HIV SCREEN 4TH GENERATION: NONREACTIVE

## 2017-05-08 LAB — RPR: RPR: NONREACTIVE

## 2017-05-10 LAB — GC/CHLAMYDIA PROBE AMP
CHLAMYDIA, DNA PROBE: NEGATIVE
Neisseria gonorrhoeae by PCR: NEGATIVE

## 2017-05-14 ENCOUNTER — Telehealth: Payer: Self-pay | Admitting: Family Medicine

## 2017-05-14 NOTE — Telephone Encounter (Signed)
I spoke to pt.

## 2017-05-14 NOTE — Telephone Encounter (Signed)
Pt was returning Tasha's call.  Please call pt back.  Thank you!

## 2018-11-05 ENCOUNTER — Ambulatory Visit: Payer: Self-pay | Admitting: *Deleted

## 2018-11-05 ENCOUNTER — Other Ambulatory Visit: Payer: Self-pay

## 2018-11-05 VITALS — BP 159/105 | HR 75

## 2018-11-05 DIAGNOSIS — Z0000 Encounter for general adult medical examination without abnormal findings: Secondary | ICD-10-CM

## 2018-11-05 NOTE — Progress Notes (Addendum)
Fasting labs drawn as last labs were 2018 and abnormal. Pt had f/u with pcp afterwards but did not return for 3 month appt as he went to his [place] country [place] an extended time after appt.  Labs to be rechecked today and if similar to previous, RN plans to make appt for pt with pcp office for virtual visit. Pt agreeable to plan.  Provided and reviewed handouts with pt based on previous lab results and anticipated pending results as pt reports no dietary or exercise changes since last labs. Handouts reviewed and provided: DM2, cholesterol, and mediterranean diet.  Mistakenly forgot to obtain Ht/Wt. Will plan to do this at result review 4/21.

## 2018-11-06 LAB — CMP12+LP+TP+TSH+6AC+PSA+CBC?
ALT: 70 IU/L — ABNORMAL HIGH (ref 0–44)
Albumin/Globulin Ratio: 2 (ref 1.2–2.2)
Albumin: 4.8 g/dL (ref 4.0–5.0)
Basos: 1 %
Calcium: 9.3 mg/dL (ref 8.7–10.2)
Chol/HDL Ratio: 4.9 ratio (ref 0.0–5.0)
GFR calc non Af Amer: 110 mL/min/{1.73_m2} (ref >59)
GGT: 95 IU/L — ABNORMAL HIGH (ref 0–65)
Globulin, Total: 2.4 g/dL (ref 1.5–4.5)
HDL: 40 mg/dL (ref >39)
Hematocrit: 45.1 % (ref 37.5–51.0)
Immature Granulocytes: 0 %
Iron: 130 ug/dL (ref 38–169)
Lymphocytes Absolute: 3.7 10*3/uL — ABNORMAL HIGH (ref 0.7–3.1)
MCHC: 34.1 g/dL (ref 31.5–35.7)
Monocytes Absolute: 0.6 10*3/uL (ref 0.1–0.9)
Neutrophils: 51 %
Phosphorus: 3.2 mg/dL (ref 2.8–4.1)
Potassium: 3.9 mmol/L (ref 3.5–5.2)
Prostate Specific Ag, Serum: 2.1 ng/mL (ref 0.0–4.0)
RDW: 13.3 % (ref 11.6–15.4)
Sodium: 139 mmol/L (ref 134–144)
T3 Uptake Ratio: 20 % — ABNORMAL LOW (ref 24–39)
T4, Total: 6 ug/dL (ref 4.5–12.0)
Total Protein: 7.2 g/dL (ref 6.0–8.5)
Triglycerides: 251 mg/dL — ABNORMAL HIGH (ref 0–149)

## 2018-11-06 LAB — CMP12+LP+TP+TSH+6AC+PSA+CBC…
AST: 38 IU/L (ref 0–40)
Alkaline Phosphatase: 93 IU/L (ref 39–117)
BUN/Creatinine Ratio: 18 (ref 9–20)
BUN: 14 mg/dL (ref 6–24)
Basophils Absolute: 0.1 10*3/uL (ref 0.0–0.2)
Bilirubin Total: 0.5 mg/dL (ref 0.0–1.2)
Chloride: 103 mmol/L (ref 96–106)
Cholesterol, Total: 195 mg/dL (ref 100–199)
Creatinine, Ser: 0.78 mg/dL (ref 0.76–1.27)
EOS (ABSOLUTE): 0.3 10*3/uL (ref 0.0–0.4)
Eos: 3 %
Estimated CHD Risk: 1 times avg. (ref 0.0–1.0)
Free Thyroxine Index: 1.2 (ref 1.2–4.9)
GFR calc Af Amer: 127 mL/min/{1.73_m2} (ref >59)
Glucose: 98 mg/dL (ref 65–99)
Hemoglobin: 15.4 g/dL (ref 13.0–17.7)
Immature Grans (Abs): 0 10*3/uL (ref 0.0–0.1)
LDH: 227 IU/L — ABNORMAL HIGH (ref 121–224)
LDL Calculated: 105 mg/dL — ABNORMAL HIGH (ref 0–99)
Lymphs: 39 %
MCH: 30.4 pg (ref 26.6–33.0)
MCV: 89 fL (ref 79–97)
Monocytes: 6 %
Neutrophils Absolute: 4.9 10*3/uL (ref 1.4–7.0)
Platelets: 300 10*3/uL (ref 150–450)
RBC: 5.06 x10E6/uL (ref 4.14–5.80)
TSH: 1.83 u[IU]/mL (ref 0.450–4.500)
Uric Acid: 7.6 mg/dL (ref 3.7–8.6)
VLDL Cholesterol Cal: 50 mg/dL — ABNORMAL HIGH (ref 5–40)
WBC: 9.7 10*3/uL (ref 3.4–10.8)

## 2018-11-06 LAB — HGB A1C W/O EAG: Hgb A1c MFr Bld: 6.1 % — ABNORMAL HIGH (ref 4.8–5.6)

## 2018-11-08 NOTE — Progress Notes (Signed)
noted 

## 2018-11-08 NOTE — Progress Notes (Signed)
Pt working second shift. Scheduled to return for result review 4/21 at 1445. Results and handouts for elevated LFTs, mediterranean diet, lipids, and prediabetes printed and placed in pt chart for appt tomorrow.

## 2018-11-09 NOTE — Progress Notes (Signed)
Noted  If on repeat labs still abnormalities consider stopping centrum and calcium supplements.

## 2018-11-11 NOTE — Progress Notes (Signed)
noted 

## 2018-11-11 NOTE — Progress Notes (Signed)
Appt made for Webex visit Monday 4/27 at 0800. Confirmed date, time, correct email address and phone number with Gabriel Rainwater, pt's spouse at his request. She verbalizes understanding of process for Webex log in and visit and denies any further questions/concerns.

## 2018-11-11 NOTE — Progress Notes (Signed)
No return call from [place] to RN. No notes or appt in [facility] if [place] called pt directly. 2nd mesg left requesting annual exam appt.

## 2018-11-15 ENCOUNTER — Encounter: Payer: No Typology Code available for payment source | Admitting: Family Medicine

## 2018-11-15 ENCOUNTER — Other Ambulatory Visit: Payer: Self-pay

## 2018-11-15 NOTE — Progress Notes (Deleted)
Virtual Visit via Telephone Note  I connected with Adrian Alvarez on 11/15/18 at 8:33 AM by telephone and verified that I am speaking with the correct person using two identifiers.   I discussed the limitations, risks, security and privacy concerns of performing an evaluation and management service by telephone and the availability of in person appointments. I also discussed with the patient that there may be a patient responsible charge related to this service. The patient expressed understanding and agreed to proceed, consent obtained  Chief complaint: Annual exam  History of Present Illness: Annual physical exam.  Last seen by me in 2018, with planned follow up in 3 months. out of country apparenty at that time. Recently had a preventative health care exam at his employer.  Elevated blood pressure: BP 159/105 on April 17 at employer recently.  Blood pressure was 142/94 when I saw him in 2018.  Obesity with prediabetes: Increase in A1c over time noted in 2018.  Discussed prediabetes diet, exercise goals with recheck in 3 months when I saw him in 2018.  A1c 6.1 on April 17, slightly down from 6.3 last year.  Hyperlipidemia: Lab Results  Component Value Date   CHOL 195 11/05/2018   HDL 40 11/05/2018   LDLCALC 105 (H) 11/05/2018   TRIG 251 (H) 11/05/2018   CHOLHDL 4.9 11/05/2018   Lab Results  Component Value Date   ALT 70 (H) 11/05/2018   AST 38 11/05/2018   GGT 95 (H) 11/05/2018   ALKPHOS 93 11/05/2018   BILITOT 0.5 11/05/2018   The 10-year ASCVD risk score Denman George(Goff DC Jr., et al., 2013) is: 3.4%   Values used to calculate the score:     Age: 43 years     Sex: Male     Is Non-Hispanic African American: No     Diabetic: No     Tobacco smoker: No     Systolic Blood Pressure: 159 mmHg     Is BP treated: No     HDL Cholesterol: 40 mg/dL     Total Cholesterol: 195 mg/dL       There are no active problems to display for this patient.  No past medical history on file.  No past surgical history on file. No Known Allergies Prior to Admission medications   Not on File   Social History   Socioeconomic History  . Marital status: Single    Spouse name: Not on file  . Number of children: Not on file  . Years of education: Not on file  . Highest education level: Not on file  Occupational History  . Not on file  Social Needs  . Financial resource strain: Not on file  . Food insecurity:    Worry: Not on file    Inability: Not on file  . Transportation needs:    Medical: Not on file    Non-medical: Not on file  Tobacco Use  . Smoking status: Never Smoker  . Smokeless tobacco: Never Used  Substance and Sexual Activity  . Alcohol use: Yes    Alcohol/week: 0.0 standard drinks  . Drug use: No  . Sexual activity: Not on file  Lifestyle  . Physical activity:    Days per week: Not on file    Minutes per session: Not on file  . Stress: Not on file  Relationships  . Social connections:    Talks on phone: Not on file    Gets together: Not on file    Attends religious  service: Not on file    Active member of club or organization: Not on file    Attends meetings of clubs or organizations: Not on file    Relationship status: Not on file  . Intimate partner violence:    Fear of current or ex partner: Not on file    Emotionally abused: Not on file    Physically abused: Not on file    Forced sexual activity: Not on file  Other Topics Concern  . Not on file  Social History Narrative  . Not on file     Observations/Objective:   Assessment and Plan: No diagnosis found.   Follow Up Instructions:    I discussed the assessment and treatment plan with the patient. The patient was provided an opportunity to ask questions and all were answered. The patient agreed with the plan and demonstrated an understanding of the instructions.   The patient was advised to call back or seek an in-person evaluation if the symptoms worsen or if the condition fails  to improve as anticipated.  I provided *** minutes of non-face-to-face time during this encounter.  Signed,   Meredith Staggers, MD Primary Care at [facility].  11/15/18

## 2018-11-16 ENCOUNTER — Ambulatory Visit: Payer: Self-pay

## 2018-11-16 ENCOUNTER — Telehealth (INDEPENDENT_AMBULATORY_CARE_PROVIDER_SITE_OTHER): Payer: PRIVATE HEALTH INSURANCE | Admitting: Family Medicine

## 2018-11-16 DIAGNOSIS — E785 Hyperlipidemia, unspecified: Secondary | ICD-10-CM

## 2018-11-16 DIAGNOSIS — Z0000 Encounter for general adult medical examination without abnormal findings: Secondary | ICD-10-CM

## 2018-11-16 DIAGNOSIS — I10 Essential (primary) hypertension: Secondary | ICD-10-CM

## 2018-11-16 DIAGNOSIS — R7303 Prediabetes: Secondary | ICD-10-CM | POA: Diagnosis not present

## 2018-11-16 DIAGNOSIS — E782 Mixed hyperlipidemia: Secondary | ICD-10-CM | POA: Diagnosis not present

## 2018-11-16 DIAGNOSIS — R030 Elevated blood-pressure reading, without diagnosis of hypertension: Secondary | ICD-10-CM

## 2018-11-16 MED ORDER — AMLODIPINE BESYLATE 5 MG PO TABS: 5.0000 mg | ORAL_TABLET | Freq: Every day | ORAL | 1 refills | Status: DC

## 2018-11-16 NOTE — Progress Notes (Signed)
CC-Annual wellness- Not having any issus at this time. Patient is scheduled to have labs done on 11/17/18 @8 :40. I ordered a Cmp and a Lipid. He had an A1c (6.1)on 11/05/18 by clinical support RN Rolly Salter.

## 2018-11-16 NOTE — Progress Notes (Signed)
Virtual Visit via Telephone Note  I connected with Adrian Alvarez on 11/16/18 at 9:39 AM by telephone and verified that I am speaking with the correct person using two identifiers.   I discussed the limitations, risks, security and privacy concerns of performing an evaluation and management service by telephone and the availability of in person appointments. I also discussed with the patient that there may be a patient responsible charge related to this service. The patient expressed understanding and agreed to proceed, consent obtained  Chief complaint: Annual exam  History of Present Illness: Adrian Alvarez is a 43 y.o. male  Recent evaluation at work with medical provider there. I last saw him for a physical in October 2018.  Elevated blood pressures previously, borderline at 139/95 when I saw him on October 2018 with plan 4459-month follow-up to decide on meds but has not been seen since that time.  [place]  Patient has history of obesity, prediabetes. Lab work obtained through work April 17.  Results for orders placed or performed in visit on 11/05/18  Male Executive Panel  Result Value Ref Range   Glucose 98 65 - 99 mg/dL   Uric Acid 7.6 3.7 - 8.6 mg/dL   BUN 14 6 - 24 mg/dL   Creatinine, Ser 8.650.78 0.76 - 1.27 mg/dL   GFR calc non Af Amer 110 >59 mL/min/1.73   GFR calc Af Amer 127 >59 mL/min/1.73   BUN/Creatinine Ratio 18 9 - 20   Sodium 139 134 - 144 mmol/L   Potassium 3.9 3.5 - 5.2 mmol/L   Chloride 103 96 - 106 mmol/L   Calcium 9.3 8.7 - 10.2 mg/dL   Phosphorus 3.2 2.8 - 4.1 mg/dL   Total Protein 7.2 6.0 - 8.5 g/dL   Albumin 4.8 4.0 - 5.0 g/dL   Globulin, Total 2.4 1.5 - 4.5 g/dL   Albumin/Globulin Ratio 2.0 1.2 - 2.2   Bilirubin Total 0.5 0.0 - 1.2 mg/dL   Alkaline Phosphatase 93 39 - 117 IU/L   LDH 227 (H) 121 - 224 IU/L   AST 38 0 - 40 IU/L   ALT 70 (H) 0 - 44 IU/L   GGT 95 (H) 0 - 65 IU/L   Iron 130 38 - 169 ug/dL   Cholesterol, Total  784195 100 - 199 mg/dL   Triglycerides 696251 (H) 0 - 149 mg/dL   HDL 40 >29>39 mg/dL   VLDL Cholesterol Cal 50 (H) 5 - 40 mg/dL   LDL Calculated 528105 (H) 0 - 99 mg/dL   Chol/HDL Ratio 4.9 0.0 - 5.0 ratio   Estimated CHD Risk 1.0 0.0 - 1.0 times avg.   TSH 1.830 0.450 - 4.500 uIU/mL   T4, Total 6.0 4.5 - 12.0 ug/dL   T3 Uptake Ratio 20 (L) 24 - 39 %   Free Thyroxine Index 1.2 1.2 - 4.9   Prostate Specific Ag, Serum 2.1 0.0 - 4.0 ng/mL   WBC 9.7 3.4 - 10.8 x10E3/uL   RBC 5.06 4.14 - 5.80 x10E6/uL   Hemoglobin 15.4 13.0 - 17.7 g/dL   Hematocrit 41.345.1 24.437.5 - 51.0 %   MCV 89 79 - 97 fL   MCH 30.4 26.6 - 33.0 pg   MCHC 34.1 31.5 - 35.7 g/dL   RDW 01.013.3 27.211.6 - 53.615.4 %   Platelets 300 150 - 450 x10E3/uL   Neutrophils 51 Not Estab. %   Lymphs 39 Not Estab. %   Monocytes 6 Not Estab. %  Eos 3 Not Estab. %   Basos 1 Not Estab. %   Neutrophils Absolute 4.9 1.4 - 7.0 x10E3/uL   Lymphocytes Absolute 3.7 (H) 0.7 - 3.1 x10E3/uL   Monocytes Absolute 0.6 0.1 - 0.9 x10E3/uL   EOS (ABSOLUTE) 0.3 0.0 - 0.4 x10E3/uL   Basophils Absolute 0.1 0.0 - 0.2 x10E3/uL   Immature Granulocytes 0 Not Estab. %   Immature Grans (Abs) 0.0 0.0 - 0.1 x10E3/uL  Hemoglobin A1c  Result Value Ref Range   Hgb A1c MFr Bld 6.1 (H) 4.8 - 5.6 %      Prediabetes: Lab Results  Component Value Date   HGBA1C 6.1 (H) 11/05/2018  does drink soda 3-4 times per week.  No regular exercise.  Fast food 2 times per week.   Elevated BP/hypertension BP Readings from Last 3 Encounters:  11/05/18 (!) 159/105  05/07/17 (!) 139/95  05/01/17 (!) 146/100  Not currently on medication. No home readings - no machine. Does have ability to have checked at work.  Constitutional: Negative for fatigue and unexpected weight change.  Eyes: Negative for visual disturbance.  Respiratory: Negative for cough, chest tightness and shortness of breath.   Cardiovascular: Negative for chest pain, palpitations and leg swelling.  Gastrointestinal: Negative  for abdominal pain and blood in stool.  Neurological: Negative for dizziness, light-headedness and headaches.   Hyperlipidemia:  Lab Results  Component Value Date   CHOL 195 11/05/2018   HDL 40 11/05/2018   LDLCALC 105 (H) 11/05/2018   TRIG 251 (H) 11/05/2018   CHOLHDL 4.9 11/05/2018   Lab Results  Component Value Date   ALT 70 (H) 11/05/2018   AST 38 11/05/2018   GGT 95 (H) 11/05/2018   ALKPHOS 93 11/05/2018   BILITOT 0.5 11/05/2018  The 10-year ASCVD risk score Denman George DC Jr., et al., 2013) is: 3.4%   Values used to calculate the score:     Age: 12 years     Sex: Male     Is Non-Hispanic African American: No     Diabetic: No     Tobacco smoker: No     Systolic Blood Pressure: 159 mmHg     Is BP treated: No     HDL Cholesterol: 40 mg/dL     Total Cholesterol: 195 mg/dL   Lab Results  Component Value Date   CREATININE 0.78 11/05/2018   Immunization History  Administered Date(s) Administered  . Influenza,inj,Quad PF,6+ Mos 05/16/2016, 05/08/2017  Tetanus: unknown. Plans to have at next in office visit.   No known FH of cancer.   Depression screen [facility] 2/9 11/16/2018 05/07/2017 03/21/2015  Decreased Interest 0 0 0  Down, Depressed, Hopeless 0 0 0  PHQ - 2 Score 0 0 0   Vision: no glasses, contacts. No recent eye doctor visit.   Dental: appointment planned last month - will be moved to later this year.   Exercise: no regular exercise. Has not measured weight.       There are no active problems to display for this patient.  No past medical history on file. No past surgical history on file. No Known Allergies Prior to Admission medications   Not on File   Social History   Socioeconomic History  . Marital status: Single    Spouse name: Not on file  . Number of children: Not on file  . Years of education: Not on file  . Highest education level: Not on file  Occupational History  . Not on file  Social  Needs  . Financial resource strain: Not on file  .  Food insecurity:    Worry: Not on file    Inability: Not on file  . Transportation needs:    Medical: Not on file    Non-medical: Not on file  Tobacco Use  . Smoking status: Never Smoker  . Smokeless tobacco: Never Used  Substance and Sexual Activity  . Alcohol use: Yes    Alcohol/week: 0.0 standard drinks  . Drug use: No  . Sexual activity: Not on file  Lifestyle  . Physical activity:    Days per week: Not on file    Minutes per session: Not on file  . Stress: Not on file  Relationships  . Social connections:    Talks on phone: Not on file    Gets together: Not on file    Attends religious service: Not on file    Active member of club or organization: Not on file    Attends meetings of clubs or organizations: Not on file    Relationship status: Not on file  . Intimate partner violence:    Fear of current or ex partner: Not on file    Emotionally abused: Not on file    Physically abused: Not on file    Forced sexual activity: Not on file  Other Topics Concern  . Not on file  Social History Narrative  . Not on file     Observations/Objective:   Assessment and Plan: Annual physical exam  - -anticipatory guidance as below in AVS, screening labs above. Health maintenance items as above in HPI discussed/recommended as applicable.   Hyperlipidemia, unspecified hyperlipidemia type Mixed hyperlipidemia - Plan: CANCELED: Lipid Panel  -Low ASCVD risk, diet/exercise approach initially with repeat testing 6 months.  Elevated blood pressure reading in office without diagnosis of hypertension Essential hypertension - Plan: amLODipine (NORVASC) 5 MG tablet, CANCELED: Comprehensive metabolic panel  -Multiple elevated readings consistent with hypertension and based on recent level will start medication.  Norvasc 5 mg daily, potential side effects/risks discussed, repeat office visit with tele-med visit in 2 weeks with home readings if available.  Diet/exercise should also help.   Prediabetes  -Stressed importance of decreasing sugar containing beverages, fast food, and need for exercise with minimum 150 minutes/week.  Start with low intensity exercise such as walking with slow progression as tolerated.  Recheck A1c 6 months  Follow Up Instructions: 2 weeks, htn    I discussed the assessment and treatment plan with the patient. The patient was provided an opportunity to ask questions and all were answered. The patient agreed with the plan and demonstrated an understanding of the instructions.   The patient was advised to call back or seek an in-person evaluation if the symptoms worsen or if the condition fails to improve as anticipated.  I provided 12 minutes of non-face-to-face time during this encounter.  Signed,   Meredith Staggers, MD Primary Care at [facility].  11/16/18

## 2018-11-16 NOTE — Patient Instructions (Addendum)
Start amlodipine 5mg  per day for blood pressure. See other info below on hypertension below and follow up in 2 weeks. If possible check some blood pressure readings at home or work for that visit.   Your blood sugar was at prediabetes level. Avoid sugar containing beverages and fast food is much as possible. Exercise most days per week with low intensity initially such as walking - 150 mins per week minimum.   Cholesterol was elevated, but not high enough for medicine for now. We can check it again in 6 months with prediabetes test at that time as well.  Let me know if there are questions.  Take care.    Keeping you healthy  Get these tests  Blood pressure- Have your blood pressure checked once a year by your healthcare provider.  Normal blood pressure is 120/80.  Weight- Have your body mass index (BMI) calculated to screen for obesity.  BMI is a measure of body fat based on height and weight. You can also calculate your own BMI at [email or web address]://www.west-[email or web address]/www.[email or web address]/bmi/.  Cholesterol- Have your cholesterol checked regularly starting at age 45, sooner may be necessary if you have diabetes, high blood pressure, if a family member developed heart diseases at an early age or if you smoke.   Chlamydia, HIV, and other sexual transmitted disease- Get screened each year until the age of 45 then within three months of each new sexual partner.  Diabetes- Have your blood sugar checked regularly if you have high blood pressure, high cholesterol, a family history of diabetes or if you are overweight.  Get these vaccines  Flu shot- Every fall.  Tetanus shot- Every 10 years.  Menactra- Single dose; prevents meningitis.  Take these steps  Don't smoke- If you do smoke, ask your healthcare provider about quitting. For tips on how to quit, go to www.smokefree.gov or call 1-800-QUIT-NOW.  Be physically active- Exercise 5 days a week for at least 30 minutes.  If you are not already physically active start slow and  gradually work up to 30 minutes of moderate physical activity.  Examples of moderate activity include walking briskly, mowing the yard, dancing, swimming bicycling, etc.  Eat a healthy diet- Eat a variety of healthy foods such as fruits, vegetables, low fat milk, low fat cheese, yogurt, lean meats, poultry, fish, beans, tofu, etc.  For more information on healthy eating, go to www.thenutritionsource.org  Drink alcohol in moderation- Limit alcohol intake two drinks or less a day.  Never drink and drive.  Dentist- Brush and floss teeth twice daily; visit your dentis twice a year.  Depression-Your emotional health is as important as your physical health.  If you're feeling down, losing interest in things you normally enjoy please talk with your healthcare provider.  Gun Safety- If you keep a gun in your home, keep it unloaded and with the safety lock on.  Bullets should be stored separately.  Helmet use- Always wear a helmet when riding a motorcycle, bicycle, rollerblading or skateboarding.  Safe sex- If you may be exposed to a sexually transmitted infection, use a condom  Seat belts- Seat bels can save your life; always wear one.  Smoke/Carbon Monoxide detectors- These detectors need to be installed on the appropriate level of your home.  Replace batteries at least once a year.  Skin Cancer- When out in the sun, cover up and use sunscreen SPF 15 or higher.  Violence- If anyone is threatening or hurting you, please tell your healthcare provider.  Prediabetes Prediabetes is  the condition of having a blood sugar (blood glucose) level that is higher than it should be, but not high enough for you to be diagnosed with type 2 diabetes. Having prediabetes puts you at risk for developing type 2 diabetes (type 2 diabetes mellitus). Prediabetes may be called impaired glucose tolerance or impaired fasting glucose. Prediabetes usually does not cause symptoms. Your health care provider can diagnose this  condition with blood tests. You may be tested for prediabetes if you are overweight and if you have at least one other risk factor for prediabetes. What is blood glucose, and how is it measured? Blood glucose refers to the amount of glucose in your bloodstream. Glucose comes from eating foods that contain sugars and starches (carbohydrates), which the body breaks down into glucose. Your blood glucose level may be measured in mg/dL (milligrams per deciliter) or mmol/L (millimoles per liter). Your blood glucose may be checked with one or more of the following blood tests:  A fasting blood glucose (FBG) test. You will not be allowed to eat (you will fast) for 8 hours or longer before a blood sample is taken. ? A normal range for FBG is 70-100 mg/dl (3.4-0.3 mmol/L).  An A1c (hemoglobin A1c) blood test. This test provides information about blood glucose control over the previous 2?68months.  An oral glucose tolerance test (OGTT). This test measures your blood glucose at two times: ? After fasting. This is your baseline level. ? Two hours after you drink a beverage that contains glucose. You may be diagnosed with prediabetes:  If your FBG is 100?125 mg/dL (5.2-4.8 mmol/L).  If your A1c level is 5.7?6.4%.  If your OGTT result is 140?199 mg/dL (1.8-59 mmol/L). These blood tests may be repeated to confirm your diagnosis. How can this condition affect me? The pancreas produces a hormone (insulin) that helps to move glucose from the bloodstream into cells. When cells in the body do not respond properly to insulin that the body makes (insulin resistance), excess glucose builds up in the blood instead of going into cells. As a result, high blood glucose (hyperglycemia) can develop, which can cause many complications. Hyperglycemia is a symptom of prediabetes. Having high blood glucose for a long time is dangerous. Too much glucose in your blood can damage your nerves and blood vessels. Long-term damage can  lead to complications from diabetes, which may include:  Heart disease.  Stroke.  Blindness.  Kidney disease.  Depression.  Poor circulation in the feet and legs, which could lead to surgical removal (amputation) in severe cases. What can increase my risk? Risk factors for prediabetes include:  Having a family member with type 2 diabetes.  Being overweight or obese.  Being older than age 61.  Being of American [place], African-American, Hispanic/Latino, or Asian/Pacific Islander descent.  Having an inactive (sedentary) lifestyle.  Having a history of heart disease.  History of gestational diabetes or polycystic ovary syndrome (PCOS), in women.  Having low levels of good cholesterol (HDL-C) or high levels of blood fats (triglycerides).  Having high blood pressure. What actions can I take to prevent diabetes?      Be physically active. ? Do moderate-intensity physical activity for 30 or more minutes on 5 or more days of the week, or as much as told by your health care provider. This could be brisk walking, biking, or water aerobics. ? Ask your health care provider what activities are safe for you. A mix of physical activities may be best, such as walking, swimming,  cycling, and strength training.  Lose weight as told by your health care provider. ? Losing 5-7% of your body weight can reverse insulin resistance. ? Your health care provider can determine how much weight loss is best for you and can help you lose weight safely.  Follow a healthy meal plan. This includes eating lean proteins, complex carbohydrates, fresh fruits and vegetables, low-fat dairy products, and healthy fats. ? Follow instructions from your health care provider about eating or drinking restrictions. ? Make an appointment to see a diet and nutrition specialist (registered dietitian) to help you create a healthy eating plan that is right for you.  Do not smoke or use any tobacco products, such as  cigarettes, chewing tobacco, and e-cigarettes. If you need help quitting, ask your health care provider.  Take over-the-counter and prescription medicines as told by your health care provider. You may be prescribed medicines that help lower the risk of type 2 diabetes.  Keep all follow-up visits as told by your health care provider. This is important. Summary  Prediabetes is the condition of having a blood sugar (blood glucose) level that is higher than it should be, but not high enough for you to be diagnosed with type 2 diabetes.  Having prediabetes puts you at risk for developing type 2 diabetes (type 2 diabetes mellitus).  To help prevent type 2 diabetes, make lifestyle changes such as being physically active and eating a healthy diet. Lose weight as told by your health care provider. This information is not intended to replace advice given to you by your health care provider. Make sure you discuss any questions you have with your health care provider. Document Released: 10/29/2015 Document Revised: 02/24/2017 Document Reviewed: 08/28/2015 Elsevier Interactive Patient Education  [employer].    If you have lab work done today you will be contacted with your lab results within the next 2 weeks.  If you have not heard from [place] then please contact us. The fastest way to get your results is to register for My Chart.   IF you received an x-ray today, you will receive an invoice from [facility]. Please contact [facility] at [phone] with questions or concerns regarding your invoice.   IF you received labwork today, you will receive an invoice from [facility]. Please contact [facility] at [phone] with questions or concerns regarding your invoice.   Our billing staff will not be able to assist you with questions regarding bills from these companies.  You will be contacted with the lab results as soon as they are available. The fastest way to get your results is  to activate your My Chart account. Instructions are located on the last page of this paperwork. If you have not heard from [place] regarding the results in 2 weeks, please contact this office.

## 2018-11-30 ENCOUNTER — Telehealth (INDEPENDENT_AMBULATORY_CARE_PROVIDER_SITE_OTHER): Payer: No Typology Code available for payment source | Admitting: Family Medicine

## 2018-11-30 ENCOUNTER — Other Ambulatory Visit: Payer: Self-pay

## 2018-11-30 DIAGNOSIS — I10 Essential (primary) hypertension: Secondary | ICD-10-CM

## 2018-11-30 NOTE — Progress Notes (Signed)
CC- Recheck HTN- Patient stated he is doing well with his med he do not need and refills right now. Patient have not checked his blood pressure. He work 3 rd shift. Have not taken his bp in 2 weeks.

## 2018-11-30 NOTE — Patient Instructions (Addendum)
   Continue to monitor blood pressure and if readings over 140/90, let me know.  Otherwise continue amlodipine same dose and follow-up in 6 months for repeat lab work including blood sugar test.  Take care.   If you have lab work done today you will be contacted with your lab results within the next 2 weeks.  If you have not heard from [place] then please contact us. The fastest way to get your results is to register for My Chart.   IF you received an x-ray today, you will receive an invoice from [facility]. Please contact [facility] at [phone] with questions or concerns regarding your invoice.   IF you received labwork today, you will receive an invoice from [facility]. Please contact [facility] at [phone] with questions or concerns regarding your invoice.   Our billing staff will not be able to assist you with questions regarding bills from these companies.  You will be contacted with the lab results as soon as they are available. The fastest way to get your results is to activate your My Chart account. Instructions are located on the last page of this paperwork. If you have not heard from [place] regarding the results in 2 weeks, please contact this office.

## 2018-11-30 NOTE — Progress Notes (Signed)
Virtual Visit via Telephone Note  I connected with Adrian Alvarez on 11/30/18 at 8:47 AM by telephone and verified that I am speaking with the correct person using two identifiers.   I discussed the limitations, risks, security and privacy concerns of performing an evaluation and management service by telephone and the availability of in person appointments. I also discussed with the patient that there may be a patient responsible charge related to this service. The patient expressed understanding and agreed to proceed, consent obtained  Chief complaint:  HTN  History of Present Illness: Hypertension: BP Readings from Last 3 Encounters:  11/05/18 (!) 159/105  05/07/17 (!) 139/95  05/01/17 (!) 146/100   Lab Results  Component Value Date   CREATININE 0.78 11/05/2018   Physical exam April 28 by telemedicine.  History of obesity, prediabetes, hypertension with multiple elevated readings.  Started on amlodipine April 28, plan follow-up with home blood pressure readings. Doing well on amlodipine Has decreased rice intake, more vegetables and fish.   BP 129/86 this morning.   Constitutional: Negative for fatigue and unexpected weight change.  Eyes: Negative for visual disturbance.  Respiratory: Negative for cough, chest tightness and shortness of breath.   Cardiovascular: Negative for chest pain, palpitations and leg swelling.  Gastrointestinal: Negative for abdominal pain and blood in stool.  Neurological: Negative for dizziness, light-headedness and headaches.     There are no active problems to display for this patient.  No past medical history on file. No past surgical history on file. No Known Allergies Prior to Admission medications   Medication Sig Start Date End Date Taking? Authorizing Provider  amLODipine (NORVASC) 5 MG tablet Take 1 tablet (5 mg total) by mouth daily. 11/16/18  Yes Shade Flood, MD   Social History   Socioeconomic History  . Marital status:  Single    Spouse name: Not on file  . Number of children: Not on file  . Years of education: Not on file  . Highest education level: Not on file  Occupational History  . Not on file  Social Needs  . Financial resource strain: Not on file  . Food insecurity:    Worry: Not on file    Inability: Not on file  . Transportation needs:    Medical: Not on file    Non-medical: Not on file  Tobacco Use  . Smoking status: Never Smoker  . Smokeless tobacco: Never Used  Substance and Sexual Activity  . Alcohol use: Yes    Alcohol/week: 0.0 standard drinks  . Drug use: No  . Sexual activity: Not on file  Lifestyle  . Physical activity:    Days per week: Not on file    Minutes per session: Not on file  . Stress: Not on file  Relationships  . Social connections:    Talks on phone: Not on file    Gets together: Not on file    Attends religious service: Not on file    Active member of club or organization: Not on file    Attends meetings of clubs or organizations: Not on file    Relationship status: Not on file  . Intimate partner violence:    Fear of current or ex partner: Not on file    Emotionally abused: Not on file    Physically abused: Not on file    Forced sexual activity: Not on file  Other Topics Concern  . Not on file  Social History Narrative  . Not on file  Observations/Objective: No distress, appropriate responses.   Assessment and Plan: Essential hypertension Improved control on amlodipine, tolerating current dose.  Continue same, monitor home readings and if over 140/90 follow-up sooner otherwise 1278-month follow-up for repeat blood work as well as A1c for prediabetes.  Follow Up Instructions:  6 months.    I discussed the assessment and treatment plan with the patient. The patient was provided an opportunity to ask questions and all were answered. The patient agreed with the plan and demonstrated an understanding of the instructions.   The patient was  advised to call back or seek an in-person evaluation if the symptoms worsen or if the condition fails to improve as anticipated.  I provided 6 minutes of non-face-to-face time during this encounter.  Signed,   Meredith StaggersJeffrey Marina Boerner, MD [facility].  11/30/18

## 2018-12-15 ENCOUNTER — Telehealth: Payer: No Typology Code available for payment source | Admitting: Family Medicine

## 2019-05-17 ENCOUNTER — Ambulatory Visit: Payer: Self-pay | Admitting: *Deleted

## 2019-05-17 ENCOUNTER — Other Ambulatory Visit: Payer: Self-pay

## 2019-05-17 VITALS — BP 156/95 | HR 84 | Wt 220.0 lb

## 2019-05-17 DIAGNOSIS — I10 Essential (primary) hypertension: Secondary | ICD-10-CM

## 2019-05-17 NOTE — Progress Notes (Signed)
Pt needs new Rx to refill Amlodipine 5mg  through [facility]. He is due for 6 month f/u with pcp in November. BP and Wt check today. Plan for fasting labs, lipid panel and A1c, next Tuesday 11/3 at 1445 as pt works 2nd shift but he sts he is fine with fasting prior to work shift. Will forward today's BP reading and next weeks lab results and BP check to pcp, Merri Ray, MD. Pt does not check BP's at home and has not returned to clinic for f/u since starting Amlodipine 6 months ago. Anticipate medication adjustment due to continued HTN. Pt made aware of same and will hopefully have new Rx by Thursday morning as he will run out of pills that day.

## 2019-05-24 ENCOUNTER — Other Ambulatory Visit: Payer: Self-pay

## 2019-05-24 ENCOUNTER — Ambulatory Visit: Payer: Self-pay | Admitting: *Deleted

## 2019-05-24 VITALS — BP 132/84 | HR 89

## 2019-05-24 DIAGNOSIS — R7303 Prediabetes: Secondary | ICD-10-CM

## 2019-05-24 DIAGNOSIS — I10 Essential (primary) hypertension: Secondary | ICD-10-CM

## 2019-05-24 DIAGNOSIS — E785 Hyperlipidemia, unspecified: Secondary | ICD-10-CM

## 2019-05-24 NOTE — Progress Notes (Signed)
Fasting labs and repeat BP check today for pending rx refill and per pcp OV notes April 2020. Will route results to pcp upon receipt and again request new amlodipine Rx. BP better controlled today.

## 2019-05-25 LAB — CMP12+LP+TP+TSH+6AC+CBC/D/PLT
ALT: 43 IU/L (ref 0–44)
AST: 23 IU/L (ref 0–40)
Albumin/Globulin Ratio: 1.7 (ref 1.2–2.2)
Albumin: 4.8 g/dL (ref 4.0–5.0)
Alkaline Phosphatase: 78 IU/L (ref 39–117)
BUN/Creatinine Ratio: 16 (ref 9–20)
BUN: 15 mg/dL (ref 6–24)
Basophils Absolute: 0.2 10*3/uL (ref 0.0–0.2)
Basos: 2 %
Bilirubin Total: 0.5 mg/dL (ref 0.0–1.2)
Calcium: 9.9 mg/dL (ref 8.7–10.2)
Chloride: 100 mmol/L (ref 96–106)
Chol/HDL Ratio: 4 ratio (ref 0.0–5.0)
Cholesterol, Total: 181 mg/dL (ref 100–199)
Creatinine, Ser: 0.94 mg/dL (ref 0.76–1.27)
EOS (ABSOLUTE): 0.1 10*3/uL (ref 0.0–0.4)
Eos: 1 %
Estimated CHD Risk: 0.7 times avg. (ref 0.0–1.0)
Free Thyroxine Index: 2.1 (ref 1.2–4.9)
GFR calc Af Amer: 113 mL/min/{1.73_m2} (ref >59)
GFR calc non Af Amer: 98 mL/min/{1.73_m2} (ref >59)
GGT: 71 IU/L — ABNORMAL HIGH (ref 0–65)
Globulin, Total: 2.9 g/dL (ref 1.5–4.5)
Glucose: 92 mg/dL (ref 65–99)
HDL: 45 mg/dL (ref >39)
Hematocrit: 46.6 % (ref 37.5–51.0)
Hemoglobin: 15.7 g/dL (ref 13.0–17.7)
Immature Grans (Abs): 0 10*3/uL (ref 0.0–0.1)
Immature Granulocytes: 0 %
Iron: 136 ug/dL (ref 38–169)
LDH: 223 IU/L (ref 121–224)
LDL Chol Calc (NIH): 100 mg/dL — ABNORMAL HIGH (ref 0–99)
Lymphocytes Absolute: 3.9 10*3/uL — ABNORMAL HIGH (ref 0.7–3.1)
Lymphs: 41 %
MCH: 29.6 pg (ref 26.6–33.0)
MCHC: 33.7 g/dL (ref 31.5–35.7)
MCV: 88 fL (ref 79–97)
Monocytes Absolute: 0.5 10*3/uL (ref 0.1–0.9)
Monocytes: 6 %
Neutrophils Absolute: 4.7 10*3/uL (ref 1.4–7.0)
Neutrophils: 50 %
Phosphorus: 3.5 mg/dL (ref 2.8–4.1)
Platelets: 301 10*3/uL (ref 150–450)
Potassium: 4.3 mmol/L (ref 3.5–5.2)
RBC: 5.31 x10E6/uL (ref 4.14–5.80)
RDW: 13 % (ref 11.6–15.4)
Sodium: 135 mmol/L (ref 134–144)
T3 Uptake Ratio: 25 % (ref 24–39)
T4, Total: 8.4 ug/dL (ref 4.5–12.0)
TSH: 1.29 u[IU]/mL (ref 0.450–4.500)
Total Protein: 7.7 g/dL (ref 6.0–8.5)
Triglycerides: 207 mg/dL — ABNORMAL HIGH (ref 0–149)
Uric Acid: 6.8 mg/dL (ref 3.7–8.6)
VLDL Cholesterol Cal: 36 mg/dL (ref 5–40)
WBC: 9.4 10*3/uL (ref 3.4–10.8)

## 2019-05-25 LAB — HGB A1C W/O EAG: Hgb A1c MFr Bld: 5.8 % — ABNORMAL HIGH (ref 4.8–5.6)

## 2019-05-26 NOTE — Progress Notes (Signed)
noted 

## 2019-05-31 ENCOUNTER — Other Ambulatory Visit: Payer: Self-pay | Admitting: *Deleted

## 2019-05-31 ENCOUNTER — Ambulatory Visit: Payer: Self-pay | Admitting: *Deleted

## 2019-05-31 ENCOUNTER — Other Ambulatory Visit: Payer: Self-pay

## 2019-05-31 VITALS — BP 144/90 | HR 86

## 2019-05-31 DIAGNOSIS — I10 Essential (primary) hypertension: Secondary | ICD-10-CM

## 2019-05-31 NOTE — Progress Notes (Signed)
Pt receives BP med through onsite workplace dispensary based on pcp management and Rx. No refills remain on med. Per May 2020 virtual OV note, pt due for 6 month labs and BP recheck. Labs drawn last week and routed to pcp. Also viewable in [facility]. Recent BPs at work as follows: 05/17/2019  156/95 05/24/2019  132/84 05/31/2019  144/90  Please advise of any Rx changes or other recommendations. Reminded pt to call office for f/u appt.   New Rx can be entered in [facility] with [facility] as preferred pharmacy. Due to Epic restrictions, Rx will default to print in office, but that copy can be shredded and I can view Rx in [facility].   Previous BP readings and lab results over past 2 weeks have been routed to pcp/office with no response to date. Please advise asap as bridge refill was made but pt will run out of meds soon. Thank you.

## 2019-06-01 MED ORDER — AMLODIPINE BESYLATE 5 MG PO TABS: 5.0000 mg | ORAL_TABLET | Freq: Every day | ORAL | 1 refills | Status: DC

## 2019-06-01 NOTE — Progress Notes (Deleted)
Please advise on message refill for pt.   He was last seen in house on 05/14/2017 His last 2 visit was tele-med on 11/16/18 &  11/30/18.  Do you want to see pt in house or get additional labs before sending in refill?  Med refill pended for provider.  Thanks

## 2019-06-01 NOTE — Progress Notes (Signed)
noted 

## 2019-06-01 NOTE — Progress Notes (Signed)
Please advise on message refill for pt.   He was last seen in house on 05/14/2017 His last 2 visit was tele-med on 11/16/18 &  11/30/18.  Do you want to see pt in house or get additional labs before sending in refill?  Med refill pended for provider.  Thanks 

## 2019-06-01 NOTE — Progress Notes (Signed)
Labs and not reviewed. BP may need slight improvement. If remaining over 140/90 - increase to 1.5 of the amlodipine 5mg  qd. Let me know if that is done as will need to adjust Rx. Otherwise refill sent if for 5mg  dosing. Agree with dietary recommendations for other abnormal labs for now.

## 2019-06-02 NOTE — Progress Notes (Signed)
Thank you for the advisement. I will contact Adrian Alvarez at work today and have him come by clinic for new BP reading today. If remains above 140/90, will instruct him to increase Amlodipine 5mg  to 1.5 tabs daily.  Will encourage/remind him to get home BP cuff for more regular home checks.   No new Rx needed at this time, will note this t-comm for new dosing instructions.   Will schedule intermittent BP checks in clinic and route to you for any new recommendations if it remains uncontrolled. Will ask pt to schedule a f/u appt with you for December prior to next refill being needed. Thank you!

## 2019-06-02 NOTE — Progress Notes (Signed)
Thanks. Did send rx yesterday for 5mg . Appreciate your help in caring for this patient.  -JG

## 2019-06-03 ENCOUNTER — Other Ambulatory Visit: Payer: Self-pay

## 2019-06-03 ENCOUNTER — Ambulatory Visit: Payer: Self-pay | Admitting: *Deleted

## 2019-06-03 VITALS — BP 136/87 | HR 93

## 2019-06-03 DIAGNOSIS — I10 Essential (primary) hypertension: Secondary | ICD-10-CM

## 2019-06-03 NOTE — Progress Notes (Signed)
Pt in for BP check. Slightly under goal of 140/90. Advised pt to go ahead and start 1.5 tabs of Amlodipine 5mg  daily as per pcp notes. Pt has BP cuff at home and encouraged him to check at least weekly and keep a log to present at next f/u with RN and pcp. Plan to recheck BP in clinic on Tuesday 06/07/19. If pt feels well on 7.5mg  Amlodipine, will plan to have him continue it and recheck in approx 3-4 weeks, prior to him running out of current Amlodipine supply. RN will notify pcp if any adverse reactions or BP remains uncontrolled. Pt and spouse verbalize understanding and agreement with plan of care. No further questions/concerns.

## 2019-11-02 ENCOUNTER — Encounter: Payer: Self-pay | Admitting: Family Medicine

## 2019-11-02 ENCOUNTER — Ambulatory Visit: Payer: PRIVATE HEALTH INSURANCE | Admitting: Family Medicine

## 2019-11-02 ENCOUNTER — Other Ambulatory Visit: Payer: Self-pay

## 2019-11-02 ENCOUNTER — Ambulatory Visit (INDEPENDENT_AMBULATORY_CARE_PROVIDER_SITE_OTHER): Payer: PRIVATE HEALTH INSURANCE

## 2019-11-02 VITALS — BP 136/92 | HR 72 | Temp 98.3°F | Ht 67.0 in | Wt 234.0 lb

## 2019-11-02 DIAGNOSIS — R079 Chest pain, unspecified: Secondary | ICD-10-CM

## 2019-11-02 DIAGNOSIS — E785 Hyperlipidemia, unspecified: Secondary | ICD-10-CM

## 2019-11-02 DIAGNOSIS — I10 Essential (primary) hypertension: Secondary | ICD-10-CM | POA: Diagnosis not present

## 2019-11-02 DIAGNOSIS — R7303 Prediabetes: Secondary | ICD-10-CM | POA: Diagnosis not present

## 2019-11-02 DIAGNOSIS — R059 Cough, unspecified: Secondary | ICD-10-CM

## 2019-11-02 DIAGNOSIS — R05 Cough: Secondary | ICD-10-CM

## 2019-11-02 MED ORDER — AMLODIPINE BESYLATE 10 MG PO TABS: 10.0000 mg | ORAL_TABLET | Freq: Every day | ORAL | 1 refills | Status: DC

## 2019-11-02 NOTE — Progress Notes (Signed)
Subjective:  Patient ID: Adrian Alvarez, male    DOB: August 28, 1975  Age: 44 y.o. MRN: [id number]  CC:  Chief Complaint  Patient presents with  . Hypertension    pt reports BP wont go down. BP ranges from 130's-140's/ 90's. pt reports no phyiscal symptoms. pt also states no known side effects from BP medication.    HPI Adrian Alvarez presents for   Here with dtr, providing some history as well.   Hypertension: Last OV in 11/2018.  Has been followed by [name] at his work.  Taking 7.5mg  of amlodipine.  No regular exercise.  Cheat pains sometimes- 2 weeks ago. 2 times in past few weeks.  Center of chest. Squeeze feeling, seconds. No associated dyspnea/diaphoresis. Noticed at rest.  Denies heartburn. Denies DOE/CP with exertion.  Occasional cough in past - no new cough.  No FH of heart disease.  Home readings:130-140.  BP Readings from Last 3 Encounters:  11/02/19 (!) 136/92  06/03/19 136/87  05/31/19 (!) 144/90   Lab Results  Component Value Date   CREATININE 0.94 05/24/2019   Obesity/prediabetes: Lab Results  Component Value Date   HGBA1C 5.8 (H) 05/24/2019  no regular exercise or diet changes.  Wt Readings from Last 3 Encounters:  11/02/19 234 lb (106.1 kg)  05/17/19 220 lb (99.8 kg)  05/07/17 228 lb (103.4 kg)  soda 1/2 can per day.  Some sweet tea.  Fast food: once per week.  Lab Results  Component Value Date   CHOL 181 05/24/2019   HDL 45 05/24/2019   LDLCALC 100 (H) 05/24/2019   TRIG 207 (H) 05/24/2019   CHOLHDL 4.0 05/24/2019    History There are no problems to display for this patient.  No past medical history on file. No past surgical history on file. No Known Allergies Prior to Admission medications   Medication Sig Start Date End Date Taking? Authorizing Provider  amLODipine (NORVASC) 5 MG tablet Take 1 tablet (5 mg total) by mouth daily. 06/01/19  Yes Shade Flood, MD   Social History   Socioeconomic History  . Marital status:  Married    Spouse name: Not on file  . Number of children: Not on file  . Years of education: Not on file  . Highest education level: Not on file  Occupational History  . Not on file  Tobacco Use  . Smoking status: Never Smoker  . Smokeless tobacco: Never Used  Substance and Sexual Activity  . Alcohol use: Yes    Alcohol/week: 0.0 standard drinks  . Drug use: No  . Sexual activity: Not on file  Other Topics Concern  . Not on file  Social History Narrative  . Not on file   Social Determinants of Health   Financial Resource Strain:   . Difficulty of Paying Living Expenses:   Food Insecurity:   . Worried About Programme researcher, broadcasting/film/video in the Last Year:   . Barista in the Last Year:   Transportation Needs:   . Freight forwarder (Medical):   Marland Kitchen Lack of Transportation (Non-Medical):   Physical Activity:   . Days of Exercise per Week:   . Minutes of Exercise per Session:   Stress:   . Feeling of Stress :   Social Connections:   . Frequency of Communication with Friends and Family:   . Frequency of Social Gatherings with Friends and Family:   . Attends Religious Services:   . Active Member of Clubs or Organizations:   .  Attends Banker Meetings:   Marland Kitchen Marital Status:   Intimate Partner Violence:   . Fear of Current or Ex-Partner:   . Emotionally Abused:   Marland Kitchen Physically Abused:   . Sexually Abused:     Review of Systems  Constitutional: Negative for fatigue and unexpected weight change.  Eyes: Negative for visual disturbance.  Respiratory: Negative for cough (occasional. ), chest tightness and shortness of breath.   Cardiovascular: Positive for chest pain. Negative for palpitations and leg swelling.  Gastrointestinal: Negative for abdominal pain and blood in stool.  Neurological: Negative for dizziness, light-headedness and headaches.     Objective:   Vitals:   11/02/19 0948  BP: (!) 136/92  Pulse: 72  Temp: 98.3 F (36.8 C)  TempSrc:  Temporal  SpO2: 97%  Weight: 234 lb (106.1 kg)  Height: 5\' 7"  (1.702 m)     Physical Exam Vitals reviewed.  Constitutional:      Appearance: He is well-developed.  HENT:     Head: Normocephalic and atraumatic.  Eyes:     Pupils: Pupils are equal, round, and reactive to light.  Neck:     Vascular: No carotid bruit or JVD.  Cardiovascular:     Rate and Rhythm: Normal rate and regular rhythm.     Heart sounds: Normal heart sounds. No murmur.  Pulmonary:     Effort: Pulmonary effort is normal.     Breath sounds: Normal breath sounds. No rales.  Abdominal:     General: Abdomen is flat.     Tenderness: There is no abdominal tenderness.  Skin:    General: Skin is warm and dry.  Neurological:     Mental Status: He is alert and oriented to person, place, and time.    DG Chest 2 View  Result Date: 11/02/2019 CLINICAL DATA:  Intermittent cough and fleeting episodes of chest pain EXAM: CHEST - 2 VIEW COMPARISON:  04/10/2011 FINDINGS: Normal heart size, mediastinal contours, and pulmonary vascularity. Calcified granuloma LEFT upper lobe with mild LEFT apical scarring. Remaining lungs clear. No acute infiltrate, pleural effusion or pneumothorax. Osseous structures unremarkable. IMPRESSION: Old granulomatous disease and mild scarring in LEFT upper lobe. Otherwise negative exam. Electronically Signed   By: 04/12/2011 M.D.   On: 11/02/2019 11:14   EKG: SR, no acute findings.   Assessment & Plan:  Adrian Alvarez is a 44 y.o. male . Chest pain, unspecified type - Plan: DG Chest 2 View, EKG 12-Lead, Ambulatory referral to Cardiology  -Few fleeting symptoms, atypical, less likely cardiac but will refer to cardiology for evaluation, decision on stress testing/further imaging.  Handout given with ER precautions given  Essential hypertension - Plan: Comprehensive metabolic panel, amLODipine (NORVASC) 10 MG tablet, Ambulatory referral to Cardiology  -Decreased control, will change amlodipine to  full 10 mg daily with orthostatic and hypotensive precautions  Cough - Plan: DG Chest 2 View  -Minimal, chest x-ray with scarring, no acute findings.  RTC precautions if persistent or increased symptoms  Prediabetes - Plan: Hemoglobin A1c Hyperlipidemia, unspecified hyperlipidemia type - Plan: Lipid panel   -Check labs, low intensity exercise recommended for weight loss if cleared by cardiology.  Diet changes also discussed.    Meds ordered this encounter  Medications  . amLODipine (NORVASC) 10 MG tablet    Sig: Take 1 tablet (10 mg total) by mouth daily.    Dispense:  90 tablet    Refill:  1   Patient Instructions    Blood pressure remains elevated,  start the new dose of amlodipine at 10 mg daily.  Watch for lightheadedness, dizziness or low blood pressure readings with that change.  Information below on chest pain.  If any return of chest pain, tightness, be seen right away.  I will refer you to cardiology to evaluate for other possible testing.  If cleared by cardiology, I do recommend starting some form of exercise, low intensity exercise such as walking initially.  That can help with blood pressure, cholesterol and prediabetes  Return to the clinic or go to the nearest emergency room if any of your symptoms worsen or new symptoms occur. .  Nonspecific Chest Pain, Adult Chest pain can be caused by many different conditions. It can be caused by a condition that is life-threatening and requires treatment right away. It can also be caused by something that is not life-threatening. If you have chest pain, it can be hard to know the difference, so it is important to get help right away to make sure that you do not have a serious condition. Some life-threatening causes of chest pain include:  Heart attack.  A tear in the body's main blood vessel (aortic dissection).  Inflammation around your heart (pericarditis).  A problem in the lungs, such as a blood clot (pulmonary embolism)  or a collapsed lung (pneumothorax). Some non life-threatening causes of chest pain include:  Heartburn.  Anxiety or stress.  Damage to the bones, muscles, and cartilage that make up your chest wall.  Pneumonia or bronchitis.  Shingles infection (varicella-zoster virus). Chest pain can feel like:  Pain or discomfort on the surface of your chest or deep in your chest.  Crushing, pressure, aching, or squeezing pain.  Burning or tingling.  Dull or sharp pain that is worse when you move, cough, or take a deep breath.  Pain or discomfort that is also felt in your back, neck, jaw, shoulder, or arm, or pain that spreads to any of these areas. Your chest pain may come and go. It may also be constant. Your health care provider will do lab tests and other studies to find the cause of your pain. Treatment will depend on the cause of your chest pain. Follow these instructions at home: Medicines  Take over-the-counter and prescription medicines only as told by your health care provider.  If you were prescribed an antibiotic, take it as told by your health care provider. Do not stop taking the antibiotic even if you start to feel better. Lifestyle   Rest as directed by your health care provider.  Do not use any products that contain nicotine or tobacco, such as cigarettes and e-cigarettes. If you need help quitting, ask your health care provider.  Do not drink alcohol.  Make healthy lifestyle choices as recommended. These may include: ? Getting regular exercise. Ask your health care provider to suggest some activities that are safe for you. ? Eating a heart-healthy diet. This includes plenty of fresh fruits and vegetables, whole grains, low-fat (lean) protein, and low-fat dairy products. A dietitian can help you find healthy eating options. ? Maintaining a healthy weight. ? Managing any other health conditions you have, such as high blood pressure (hypertension) or diabetes. ? Reducing  stress, such as with yoga or relaxation techniques. General instructions  Pay attention to any changes in your symptoms. Tell your health care provider about them or any new symptoms.  Avoid any activities that cause chest pain.  Keep all follow-up visits as told by your health care provider.  This is important. This includes visits for any further testing if your chest pain does not go away. Contact a health care provider if:  Your chest pain does not go away.  You feel depressed.  You have a fever. Get help right away if:  Your chest pain gets worse.  You have a cough that gets worse, or you cough up blood.  You have severe pain in your abdomen.  You faint.  You have sudden, unexplained chest discomfort.  You have sudden, unexplained discomfort in your arms, back, neck, or jaw.  You have shortness of breath at any time.  You suddenly start to sweat, or your skin gets clammy.  You feel nausea or you vomit.  You suddenly feel lightheaded or dizzy.  You have severe weakness, or unexplained weakness or fatigue.  Your heart begins to beat quickly, or it feels like it is skipping beats. These symptoms may represent a serious problem that is an emergency. Do not wait to see if the symptoms will go away. Get medical help right away. Call your local emergency services (911 in the [place] Do not drive yourself to the hospital. Summary  Chest pain can be caused by a condition that is serious and requires urgent treatment. It may also be caused by something that is not life-threatening.  If you have chest pain, it is very important to see your health care provider. Your health care provider may do lab tests and other studies to find the cause of your pain.  Follow your health care provider's instructions on taking medicines, making lifestyle changes, and getting emergency treatment if symptoms become worse.  Keep all follow-up visits as told by your health care provider. This  includes visits for any further testing if your chest pain does not go away. This information is not intended to replace advice given to you by your health care provider. Make sure you discuss any questions you have with your health care provider. Document Revised: 01/07/2018 Document Reviewed: 01/07/2018 Elsevier Patient Education  [employer].   If you have lab work done today you will be contacted with your lab results within the next 2 weeks.  If you have not heard from [place] then please contact us. The fastest way to get your results is to register for My Chart.   IF you received an x-ray today, you will receive an invoice from [facility]. Please contact [facility] at [phone] with questions or concerns regarding your invoice.   IF you received labwork today, you will receive an invoice from [facility]. Please contact [facility] at [phone] with questions or concerns regarding your invoice.   Our billing staff will not be able to assist you with questions regarding bills from these companies.  You will be contacted with the lab results as soon as they are available. The fastest way to get your results is to activate your My Chart account. Instructions are located on the last page of this paperwork. If you have not heard from [place] regarding the results in 2 weeks, please contact this office.         Signed, Meredith Staggers, MD [facility]

## 2019-11-02 NOTE — Patient Instructions (Addendum)
Blood pressure remains elevated, start the new dose of amlodipine at 10 mg daily.  Watch for lightheadedness, dizziness or low blood pressure readings with that change.  Information below on chest pain.  If any return of chest pain, tightness, be seen right away.  I will refer you to cardiology to evaluate for other possible testing.  If cleared by cardiology, I do recommend starting some form of exercise, low intensity exercise such as walking initially.  That can help with blood pressure, cholesterol and prediabetes  Return to the clinic or go to the nearest emergency room if any of your symptoms worsen or new symptoms occur. .  Nonspecific Chest Pain, Adult Chest pain can be caused by many different conditions. It can be caused by a condition that is life-threatening and requires treatment right away. It can also be caused by something that is not life-threatening. If you have chest pain, it can be hard to know the difference, so it is important to get help right away to make sure that you do not have a serious condition. Some life-threatening causes of chest pain include:  Heart attack.  A tear in the body's main blood vessel (aortic dissection).  Inflammation around your heart (pericarditis).  A problem in the lungs, such as a blood clot (pulmonary embolism) or a collapsed lung (pneumothorax). Some non life-threatening causes of chest pain include:  Heartburn.  Anxiety or stress.  Damage to the bones, muscles, and cartilage that make up your chest wall.  Pneumonia or bronchitis.  Shingles infection (varicella-zoster virus). Chest pain can feel like:  Pain or discomfort on the surface of your chest or deep in your chest.  Crushing, pressure, aching, or squeezing pain.  Burning or tingling.  Dull or sharp pain that is worse when you move, cough, or take a deep breath.  Pain or discomfort that is also felt in your back, neck, jaw, shoulder, or arm, or pain that spreads to  any of these areas. Your chest pain may come and go. It may also be constant. Your health care provider will do lab tests and other studies to find the cause of your pain. Treatment will depend on the cause of your chest pain. Follow these instructions at home: Medicines  Take over-the-counter and prescription medicines only as told by your health care provider.  If you were prescribed an antibiotic, take it as told by your health care provider. Do not stop taking the antibiotic even if you start to feel better. Lifestyle   Rest as directed by your health care provider.  Do not use any products that contain nicotine or tobacco, such as cigarettes and e-cigarettes. If you need help quitting, ask your health care provider.  Do not drink alcohol.  Make healthy lifestyle choices as recommended. These may include: ? Getting regular exercise. Ask your health care provider to suggest some activities that are safe for you. ? Eating a heart-healthy diet. This includes plenty of fresh fruits and vegetables, whole grains, low-fat (lean) protein, and low-fat dairy products. A dietitian can help you find healthy eating options. ? Maintaining a healthy weight. ? Managing any other health conditions you have, such as high blood pressure (hypertension) or diabetes. ? Reducing stress, such as with yoga or relaxation techniques. General instructions  Pay attention to any changes in your symptoms. Tell your health care provider about them or any new symptoms.  Avoid any activities that cause chest pain.  Keep all follow-up visits as told by your  health care provider. This is important. This includes visits for any further testing if your chest pain does not go away. Contact a health care provider if:  Your chest pain does not go away.  You feel depressed.  You have a fever. Get help right away if:  Your chest pain gets worse.  You have a cough that gets worse, or you cough up blood.  You have  severe pain in your abdomen.  You faint.  You have sudden, unexplained chest discomfort.  You have sudden, unexplained discomfort in your arms, back, neck, or jaw.  You have shortness of breath at any time.  You suddenly start to sweat, or your skin gets clammy.  You feel nausea or you vomit.  You suddenly feel lightheaded or dizzy.  You have severe weakness, or unexplained weakness or fatigue.  Your heart begins to beat quickly, or it feels like it is skipping beats. These symptoms may represent a serious problem that is an emergency. Do not wait to see if the symptoms will go away. Get medical help right away. Call your local emergency services (911 in the [place] Do not drive yourself to the hospital. Summary  Chest pain can be caused by a condition that is serious and requires urgent treatment. It may also be caused by something that is not life-threatening.  If you have chest pain, it is very important to see your health care provider. Your health care provider may do lab tests and other studies to find the cause of your pain.  Follow your health care provider's instructions on taking medicines, making lifestyle changes, and getting emergency treatment if symptoms become worse.  Keep all follow-up visits as told by your health care provider. This includes visits for any further testing if your chest pain does not go away. This information is not intended to replace advice given to you by your health care provider. Make sure you discuss any questions you have with your health care provider. Document Revised: 01/07/2018 Document Reviewed: 01/07/2018 Elsevier Patient Education  [employer].   If you have lab work done today you will be contacted with your lab results within the next 2 weeks.  If you have not heard from [place] then please contact us. The fastest way to get your results is to register for My Chart.   IF you received an x-ray today, you will receive an invoice  from [facility]. Please contact [facility] at [phone] with questions or concerns regarding your invoice.   IF you received labwork today, you will receive an invoice from [facility]. Please contact [facility] at [phone] with questions or concerns regarding your invoice.   Our billing staff will not be able to assist you with questions regarding bills from these companies.  You will be contacted with the lab results as soon as they are available. The fastest way to get your results is to activate your My Chart account. Instructions are located on the last page of this paperwork. If you have not heard from [place] regarding the results in 2 weeks, please contact this office.

## 2019-11-03 LAB — LIPID PANEL
Chol/HDL Ratio: 5.1 ratio — ABNORMAL HIGH (ref 0.0–5.0)
Cholesterol, Total: 219 mg/dL — ABNORMAL HIGH (ref 100–199)
HDL: 43 mg/dL (ref >39)
LDL Chol Calc (NIH): 148 mg/dL — ABNORMAL HIGH (ref 0–99)
Triglycerides: 154 mg/dL — ABNORMAL HIGH (ref 0–149)
VLDL Cholesterol Cal: 28 mg/dL (ref 5–40)

## 2019-11-03 LAB — HEMOGLOBIN A1C
Est. average glucose Bld gHb Est-mCnc: 131 mg/dL
Hgb A1c MFr Bld: 6.2 % — ABNORMAL HIGH (ref 4.8–5.6)

## 2019-11-03 LAB — COMPREHENSIVE METABOLIC PANEL
ALT: 49 IU/L — ABNORMAL HIGH (ref 0–44)
AST: 27 IU/L (ref 0–40)
Albumin/Globulin Ratio: 1.7 (ref 1.2–2.2)
Albumin: 4.7 g/dL (ref 4.0–5.0)
Alkaline Phosphatase: 82 IU/L (ref 39–117)
BUN/Creatinine Ratio: 12 (ref 9–20)
BUN: 12 mg/dL (ref 6–24)
Bilirubin Total: 0.4 mg/dL (ref 0.0–1.2)
CO2: 22 mmol/L (ref 20–29)
Calcium: 10.2 mg/dL (ref 8.7–10.2)
Chloride: 104 mmol/L (ref 96–106)
Creatinine, Ser: 0.97 mg/dL (ref 0.76–1.27)
GFR calc Af Amer: 109 mL/min/{1.73_m2} (ref >59)
GFR calc non Af Amer: 94 mL/min/{1.73_m2} (ref >59)
Globulin, Total: 2.7 g/dL (ref 1.5–4.5)
Glucose: 96 mg/dL (ref 65–99)
Potassium: 4.6 mmol/L (ref 3.5–5.2)
Sodium: 139 mmol/L (ref 134–144)
Total Protein: 7.4 g/dL (ref 6.0–8.5)

## 2019-11-23 NOTE — Progress Notes (Signed)
Date:  11/24/2019   ID:  Adrian Alvarez, DOB [birth year], MRN [id number]  PCP:  Wendie Agreste, MD  Cardiologist:  Rex Kras, DO, [facility] (established care 11/24/2019)  REASON FOR CONSULT: Chest pain and hypertension   REQUESTING PHYSICIAN:  Wendie Agreste, MD [address] [place],  [place] [address]  Chief Complaint  Patient presents with  . Chest Pain  . Hypertension    HPI  Adrian Alvarez is a 44 y.o. male who is being seen today for the evaluation of chest pain at the request of Wendie Agreste, MD. Patient's past medical history and cardiac risk factors include: Hypertension, hyperlipidemia, prediabetes, obesity.  Patient is accompanied by his daughter at today's office visit.  Patient is [place] descent and his daughter is providing majority of the history of present illness.  Patient states that he recently went for follow-up with his PCP and he had mentioned some discomfort in his chest and is here for evaluation of chest pain.  Patient states that he has had about 2 episodes of chest pain since January 2021.  Patient states that during his last chest pain episode the discomfort was located substernally, lasted for a few seconds, nonradiating, not associated with effort related activities noted to resolve with rest.  The pain is usually self-limited and happens randomly.  No associated symptoms of nausea, vomiting, diaphoresis.  No premature coronary disease in the family or sudden cardiac death.  Denies prior history of coronary artery disease, myocardial infarction, congestive heart failure, deep venous thrombosis, pulmonary embolism, stroke, transient ischemic attack.  FUNCTIONAL STATUS: No structured exercise program or daily routine.  ALLERGIES: No Known Allergies  MEDICATION LIST PRIOR TO VISIT: Current Meds  Medication Sig  . amLODipine (NORVASC) 10 MG tablet Take 1 tablet (10 mg total) by mouth daily.  . Calcium Carbonate (CALTRATE 600 PO) Take by mouth.    . Multiple Vitamin (MULTIVITAMIN) tablet Take 1 tablet by mouth daily.  . NON FORMULARY Take 1 capsule by mouth daily. Test x 180 boost     PAST MEDICAL HISTORY: Past Medical History:  Diagnosis Date  . Hyperlipidemia   . Hypertension   . Pre-diabetes   . Tuberculosis     PAST SURGICAL HISTORY: History reviewed. No pertinent surgical history.  FAMILY HISTORY: The patient family history includes Healthy in his brother, brother, sister, sister, sister, sister, and sister; Hypertension in his father; Thyroid disease in his mother and sister.  SOCIAL HISTORY:  The patient  reports that he has never smoked. He has never used smokeless tobacco. He reports current alcohol use. He reports that he does not use drugs.  REVIEW OF SYSTEMS: Review of Systems  Constitution: Negative for chills and fever.  HENT: Negative for hoarse voice and nosebleeds.   Eyes: Negative for discharge, double vision and pain.  Cardiovascular: Negative for chest pain, claudication, dyspnea on exertion, leg swelling, near-syncope, orthopnea, palpitations, paroxysmal nocturnal dyspnea and syncope.  Respiratory: Negative for hemoptysis and shortness of breath.   Musculoskeletal: Negative for muscle cramps and myalgias.  Gastrointestinal: Negative for abdominal pain, constipation, diarrhea, hematemesis, hematochezia, melena, nausea and vomiting.  Neurological: Negative for dizziness and light-headedness.    PHYSICAL EXAM: Vitals with BMI 11/24/2019 11/24/2019 11/02/2019  Height - 5\' 7"  5\' 7"   Weight - 237 lbs 5 oz 234 lbs  BMI - 22.29 79.89  Systolic 211 941 740  Diastolic 97 814 92  Pulse 72 69 72    CONSTITUTIONAL: Well-developed and well-nourished. No acute distress.  SKIN: Skin is warm and dry. No rash noted. No cyanosis. No pallor. No jaundice HEAD: Normocephalic and atraumatic.  EYES: No scleral icterus MOUTH/THROAT: Moist oral membranes.  NECK: No JVD present. No thyromegaly noted. No carotid bruits   LYMPHATIC: No visible cervical adenopathy.  CHEST Normal respiratory effort. No intercostal retractions  LUNGS: Clear to auscultation bilaterally.  No stridor. No wheezes. No rales.  CARDIOVASCULAR: Regular rate and rhythm, positive S1-S2, no murmurs rubs or gallops appreciated ABDOMINAL: Obese, soft, nontender, nondistended, positive bowel sounds all 4 quadrants. No apparent ascites.  EXTREMITIES: No peripheral edema  HEMATOLOGIC: No significant bruising NEUROLOGIC: Oriented to person, place, and time. Nonfocal. Normal muscle tone.  PSYCHIATRIC: Normal mood and affect. Normal behavior. Cooperative  CARDIAC DATABASE: EKG: 11/24/2019: Normal sinus rhythm, 71 bpm, normal axis, no underlying ischemia or injury pattern.   Echocardiogram: None  Stress Testing: None  Heart Catheterization: None  LABORATORY DATA: CBC Latest Ref Rng & Units 05/24/2019 11/05/2018 05/01/2017  WBC 3.4 - 10.8 x10E3/uL 9.4 9.7 9.0  Hemoglobin 13.0 - 17.7 g/dL 40.1 02.7 25.3  Hematocrit 37.5 - 51.0 % 46.6 45.1 46.1  Platelets 150 - 450 x10E3/uL 301 300 316    CMP Latest Ref Rng & Units 11/02/2019 05/24/2019 11/05/2018  Glucose 65 - 99 mg/dL 96 92 98  BUN 6 - 24 mg/dL 12 15 14   Creatinine 0.76 - 1.27 mg/dL 6.64 4.03  Sodium 134 - 144 mmol/L 139 135 139  Potassium 3.5 - 5.2 mmol/L 4.6 4.3 3.9  Chloride 96 - 106 mmol/L 104 100 103  CO2 20 - 29 mmol/L 22 - -  Calcium 8.7 - 10.2 mg/dL 4.74 9.9 9.3  Total Protein 6.0 - 8.5 g/dL 7.4 7.7 7.2  Total Bilirubin 0.0 - 1.2 mg/dL 0.4 0.5 0.5  Alkaline Phos 39 - 117 IU/L 82 78 93  AST 0 - 40 IU/L 27 23 38  ALT 0 - 44 IU/L 49(H) 43 70(H)    Lipid Panel     Component Value Date/Time   CHOL 219 (H) 11/02/2019 1114   TRIG 154 (H) 11/02/2019 1114   HDL 43 11/02/2019 1114   CHOLHDL 5.1 (H) 11/02/2019 1114   CHOLHDL 6.8 (H) 03/21/2015 2107   VLDL 60 (H) 03/21/2015 2107   LDLCALC 148 (H) 11/02/2019 1114   LABVLDL 28 11/02/2019 1114    Lab Results  Component  Value Date   HGBA1C 6.2 (H) 11/02/2019   HGBA1C 5.8 (H) 05/24/2019   HGBA1C 6.1 (H) 11/05/2018   No components found for: NTPROBNP Lab Results  Component Value Date   TSH 1.290 05/24/2019   TSH 1.830 11/05/2018   TSH 1.650 05/01/2017    BMP Recent Labs    05/24/19 1517 11/02/19 1114  NA 135 139  K 4.3 4.6  CL 100 104  CO2  --  22  GLUCOSE 92 96  BUN 15 12  CREATININE 0.94 0.97  CALCIUM 9.9 10.2  GFRNONAA 98 94  GFRAA 113 109   HEMOGLOBIN A1C Lab Results  Component Value Date   HGBA1C 6.2 (H) 11/02/2019   MPG 120 (H) 03/21/2015    IMPRESSION:    ICD-10-CM   1. Precordial chest pain  R07.2 EKG 12-Lead    PCV ECHOCARDIOGRAM COMPLETE    PCV MYOCARDIAL PERFUSION WO LEXISCAN  2. Benign hypertension  I10   3. Mixed hyperlipidemia  E78.2   4. Class 2 obesity due to excess calories without serious comorbidity with body mass index (BMI) of 37.0 to  37.9 in adult  E66.09    Z68.37   5. Prediabetes  R73.03 PCV MYOCARDIAL PERFUSION WO LEXISCAN     RECOMMENDATIONS: Chevon Laufer is a 44 y.o. male whose past medical history and cardiac risk factors include: Hypertension, hyperlipidemia, prediabetes, obesity.  Precordial chest pain:   Patient's chest discomfort appears to be noncardiac in nature; however, he has multiple cardiovascular risk factors and therefore would recommend an ischemic evaluation.     EKG reviewed.  Echocardiogram will be ordered to evaluate for structural heart disease and left ventricular systolic function.    Nuclear stress test recommended to evaluate for reversible ischemia.    Patient is educated on the importance of risk factor modifications.  His recent lipid profile is not at goal recommended lifestyle modifications and even considering pharmacological therapy.    Patient is also prediabetic, educated on importance of better glycemic control.    And his blood pressures are not well controlled.  Clinically he appears to have features of  sleep apnea and may benefit from sleep evaluation as well.    Patient is informed in regards to what typical chest pain is and to seek medical attention sooner if the symptoms surface.  He is also educated on seeking medical attention sooner if his atypical chest discomfort increases in frequency intensity or duration.  Benign essential hypertension: Currently managed by primary team.  I have asked him to keep a log of his blood pressures and to take it in to his next PCP visit the possibly uptitrate his pharmacological therapy if clinically warranted.  Hyperlipidemia, mixed: During his last visit with PCP fasting lipid profile was obtained.  His most recent lipid profile is not at goal.  Would recommend a combination of lifestyle changes and may consider pharmacological therapy.  Will defer to PCP.  Obesity, due to excess calories: . Body mass index is 37.17 kg/m. . I reviewed with the patient the importance of diet, regular physical activity/exercise, weight loss.   . Patient is educated on increasing physical activity gradually as tolerated.  With the goal of moderate intensity exercise for 30 minutes a day 5 days a week.  FINAL MEDICATION LIST END OF ENCOUNTER: No orders of the defined types were placed in this encounter.   There are no discontinued medications.   Current Outpatient Medications:  .  amLODipine (NORVASC) 10 MG tablet, Take 1 tablet (10 mg total) by mouth daily., Disp: 90 tablet, Rfl: 1 .  Calcium Carbonate (CALTRATE 600 PO), Take by mouth., Disp: , Rfl:  .  Multiple Vitamin (MULTIVITAMIN) tablet, Take 1 tablet by mouth daily., Disp: , Rfl:  .  NON FORMULARY, Take 1 capsule by mouth daily. Test x 180 boost, Disp: , Rfl:   Orders Placed This Encounter  Procedures  . PCV MYOCARDIAL PERFUSION WO LEXISCAN  . EKG 12-Lead  . PCV ECHOCARDIOGRAM COMPLETE   --Continue cardiac medications as reconciled in final medication list. --Return in about 4 weeks (around 12/22/2019) for  re-evaluation of symptoms and test results. . Or sooner if needed. --Continue follow-up with your primary care physician regarding the management of your other chronic comorbid conditions.  Patient's questions and concerns were addressed to his satisfaction. He voices understanding of the instructions provided during this encounter.   This note was created using a voice recognition software as a result there may be grammatical errors inadvertently enclosed that do not reflect the nature of this encounter. Every attempt is made to correct such errors.  Yakub Lodes, DO,  [facility]  Pager: [phone] Office: [phone]

## 2019-11-24 ENCOUNTER — Ambulatory Visit: Payer: No Typology Code available for payment source | Admitting: Cardiology

## 2019-11-24 ENCOUNTER — Other Ambulatory Visit: Payer: Self-pay

## 2019-11-24 ENCOUNTER — Encounter: Payer: Self-pay | Admitting: Cardiology

## 2019-11-24 VITALS — BP 137/97 | HR 72 | Temp 98.1°F | Ht 67.0 in | Wt 237.3 lb

## 2019-11-24 DIAGNOSIS — E6609 Other obesity due to excess calories: Secondary | ICD-10-CM

## 2019-11-24 DIAGNOSIS — I10 Essential (primary) hypertension: Secondary | ICD-10-CM

## 2019-11-24 DIAGNOSIS — R7303 Prediabetes: Secondary | ICD-10-CM

## 2019-11-24 DIAGNOSIS — E782 Mixed hyperlipidemia: Secondary | ICD-10-CM

## 2019-11-24 DIAGNOSIS — R072 Precordial pain: Secondary | ICD-10-CM

## 2019-12-12 ENCOUNTER — Other Ambulatory Visit: Payer: No Typology Code available for payment source

## 2019-12-14 ENCOUNTER — Encounter: Payer: Self-pay | Admitting: Family Medicine

## 2019-12-14 ENCOUNTER — Ambulatory Visit (INDEPENDENT_AMBULATORY_CARE_PROVIDER_SITE_OTHER): Payer: PRIVATE HEALTH INSURANCE | Admitting: Family Medicine

## 2019-12-14 ENCOUNTER — Other Ambulatory Visit: Payer: Self-pay

## 2019-12-14 VITALS — BP 128/88 | HR 66 | Temp 97.7°F | Resp 15 | Ht 67.0 in | Wt 234.8 lb

## 2019-12-14 DIAGNOSIS — R7303 Prediabetes: Secondary | ICD-10-CM | POA: Diagnosis not present

## 2019-12-14 DIAGNOSIS — I10 Essential (primary) hypertension: Secondary | ICD-10-CM

## 2019-12-14 DIAGNOSIS — E785 Hyperlipidemia, unspecified: Secondary | ICD-10-CM | POA: Diagnosis not present

## 2019-12-14 MED ORDER — AMLODIPINE BESYLATE 10 MG PO TABS: 10.0000 mg | ORAL_TABLET | Freq: Every day | ORAL | 1 refills | Status: DC

## 2019-12-14 NOTE — Patient Instructions (Addendum)
     If you have lab work done today you will be contacted with your lab results within the next 2 weeks.  If you have not heard from [place] then please contact us. The fastest way to get your results is to register for My Chart.   IF you received an x-ray today, you will receive an invoice from [facility]. Please contact [facility] at [phone] with questions or concerns regarding your invoice.   IF you received labwork today, you will receive an invoice from [facility]. Please contact [facility] at [phone] with questions or concerns regarding your invoice.   Our billing staff will not be able to assist you with questions regarding bills from these companies.  You will be contacted with the lab results as soon as they are available. The fastest way to get your results is to activate your My Chart account. Instructions are located on the last page of this paperwork. If you have not heard from [place] regarding the results in 2 weeks, please contact this office.    No med changes for now. Continue to watch diet, low intensity exercise. Keep up the good work!

## 2019-12-14 NOTE — Progress Notes (Signed)
Subjective:  Patient ID: Adrian Alvarez, male    DOB: 08/24/1975  Age: 44 y.o. MRN: [id number]  CC:  Chief Complaint  Patient presents with  . Hypertension    pt denies physical stmptoms and side effects from meds, pt daughter states she has not been taking his BP,     HPI [name] presents for    Hypertension: Amlodipine 10 mg daily, increased from 5 mg on April 14. Was referred to cardiology for intermittent chest pains, thought to be noncardiac but with multiple cardiac risk factors ischemic evaluation planned.  Echo and stress test ordered - next month.  No further chest pains.  Home readings: none. No new ankle swelling or other side effects of meds. BP Readings from Last 3 Encounters:  12/14/19 128/88  11/24/19 (!) 137/97  11/02/19 (!) 136/92   Lab Results  Component Value Date   CREATININE 0.97 11/02/2019    Hyperlipidemia: ASCVD score is 3.7% on April 14.  Not currently on statin.  Plan for diet/exercise approach, also for prediabetes with A1c of 6.2.  Walking for exercise.  Has decreased meat, more white meat, more vegetables.  Lab Results  Component Value Date   CHOL 219 (H) 11/02/2019   HDL 43 11/02/2019   LDLCALC 148 (H) 11/02/2019   TRIG 154 (H) 11/02/2019   CHOLHDL 5.1 (H) 11/02/2019   Lab Results  Component Value Date   ALT 49 (H) 11/02/2019   AST 27 11/02/2019   GGT 71 (H) 05/24/2019   ALKPHOS 82 11/02/2019   BILITOT 0.4 11/02/2019      History There are no problems to display for this patient.  Past Medical History:  Diagnosis Date  . Hyperlipidemia   . Hypertension   . Pre-diabetes   . Tuberculosis    No past surgical history on file. No Known Allergies Prior to Admission medications   Medication Sig Start Date End Date Taking? Authorizing Provider  amLODipine (NORVASC) 10 MG tablet Take 1 tablet (10 mg total) by mouth daily. 11/02/19  Yes Wendie Agreste, MD  Calcium Carbonate (CALTRATE 600 PO) Take by mouth.   Yes [provider]  Multiple Vitamin (MULTIVITAMIN) tablet Take 1 tablet by mouth daily.   Yes [provider]  NON FORMULARY Take 1 capsule by mouth daily. Test x 180 boost   Yes [provider]   Social History   Socioeconomic History  . Marital status: Married    Spouse name: Not on file  . Number of children: 1  . Years of education: Not on file  . Highest education level: Not on file  Occupational History  . Not on file  Tobacco Use  . Smoking status: Never Smoker  . Smokeless tobacco: Never Used  Substance and Sexual Activity  . Alcohol use: Yes    Alcohol/week: 0.0 standard drinks    Comment: occ  . Drug use: No  . Sexual activity: Not on file  Other Topics Concern  . Not on file  Social History Narrative  . Not on file   Social Determinants of Health   Financial Resource Strain:   . Difficulty of Paying Living Expenses:   Food Insecurity:   . Worried About Charity fundraiser in the Last Year:   . Arboriculturist in the Last Year:   Transportation Needs:   . Film/video editor (Medical):   Marland Kitchen Lack of Transportation (Non-Medical):   Physical Activity:   . Days of Exercise per  Week:   . Minutes of Exercise per Session:   Stress:   . Feeling of Stress :   Social Connections:   . Frequency of Communication with Friends and Family:   . Frequency of Social Gatherings with Friends and Family:   . Attends Religious Services:   . Active Member of Clubs or Organizations:   . Attends Banker Meetings:   Marland Kitchen Marital Status:   Intimate Partner Violence:   . Fear of Current or Ex-Partner:   . Emotionally Abused:   Marland Kitchen Physically Abused:   . Sexually Abused:     Review of Systems  Constitutional: Negative for fatigue and unexpected weight change.  Eyes: Negative for visual disturbance.  Respiratory: Negative for cough, chest tightness and shortness of breath.   Cardiovascular: Negative for chest pain, palpitations and leg swelling.    Gastrointestinal: Negative for abdominal pain and blood in stool.  Neurological: Negative for dizziness, light-headedness and headaches.     Objective:   Vitals:   12/14/19 0819 12/14/19 0823  BP: (!) 137/91 128/88  Pulse: 66   Resp: 15   Temp: 97.7 F (36.5 C)   TempSrc: Temporal   SpO2: 99%   Weight: 234 lb 12.8 oz (106.5 kg)   Height: 5\' 7"  (1.702 m)      Physical Exam Vitals reviewed.  Constitutional:      Appearance: He is well-developed.  HENT:     Head: Normocephalic and atraumatic.  Eyes:     Pupils: Pupils are equal, round, and reactive to light.  Neck:     Vascular: No carotid bruit or JVD.  Cardiovascular:     Rate and Rhythm: Normal rate and regular rhythm.     Heart sounds: Normal heart sounds. No murmur.  Pulmonary:     Effort: Pulmonary effort is normal.     Breath sounds: Normal breath sounds. No rales.  Skin:    General: Skin is warm and dry.  Neurological:     Mental Status: He is alert and oriented to person, place, and time.      Assessment & Plan:  [name] is a 44 y.o. male . Prediabetes  - commended on diet/exercise. Repeat A1c in 5 months.   Essential hypertension - Plan: amLODipine (NORVASC) 10 MG tablet  -  Stable, tolerating current regimen. Medications refilled.  Hyperlipidemia, unspecified hyperlipidemia type  - diet/exercise approach. Has cardiology testing planned. Chest pain free at this time.   Meds ordered this encounter  Medications  . amLODipine (NORVASC) 10 MG tablet    Sig: Take 1 tablet (10 mg total) by mouth daily.    Dispense:  90 tablet    Refill:  1   Patient Instructions       If you have lab work done today you will be contacted with your lab results within the next 2 weeks.  If you have not heard from 54 then please contact us. The fastest way to get your results is to register for My Chart.   IF you received an x-ray today, you will receive an invoice from [facility]. Please contact  [facility] at [phone] with questions or concerns regarding your invoice.   IF you received labwork today, you will receive an invoice from [facility]. Please contact [facility] at [phone] with questions or concerns regarding your invoice.   Our billing staff will not be able to assist you with questions regarding bills from these companies.  You will be contacted with the lab  results as soon as they are available. The fastest way to get your results is to activate your My Chart account. Instructions are located on the last page of this paperwork. If you have not heard from [place] regarding the results in 2 weeks, please contact this office.    No med changes for now. Continue to watch diet, low intensity exercise. Keep up the good work!      Signed, Meredith Staggers, MD [facility]

## 2019-12-14 NOTE — Addendum Note (Signed)
Addended by: Lorenda Hatchet R on: 12/14/2019 10:25 AM   Modules accepted: Orders

## 2019-12-26 ENCOUNTER — Other Ambulatory Visit: Payer: Self-pay

## 2019-12-26 ENCOUNTER — Ambulatory Visit: Payer: No Typology Code available for payment source | Admitting: Cardiology

## 2019-12-26 ENCOUNTER — Encounter: Payer: Self-pay | Admitting: Cardiology

## 2019-12-26 VITALS — BP 132/98 | HR 69 | Resp 16 | Ht 67.0 in | Wt 232.5 lb

## 2019-12-26 DIAGNOSIS — E782 Mixed hyperlipidemia: Secondary | ICD-10-CM

## 2019-12-26 DIAGNOSIS — E6609 Other obesity due to excess calories: Secondary | ICD-10-CM

## 2019-12-26 DIAGNOSIS — R072 Precordial pain: Secondary | ICD-10-CM

## 2019-12-26 DIAGNOSIS — I10 Essential (primary) hypertension: Secondary | ICD-10-CM

## 2019-12-26 NOTE — Progress Notes (Signed)
Adrian Alvarez Date of Birth: Jan 06, 1976 MRN: [id number] Primary Care Provider:Greene, Ranell Patrick, MD Primary Cardiologist: Rex Kras, DO, [facility] (established care 11/24/2019)  Date: 12/26/19 Last Office Visit: 11/24/2019  Chief Complaint  Patient presents with   Chest Pain    Re-evaluation   Follow-up    4 week    HPI  Adrian Alvarez is a 44 y.o. male who presents to the office with a  chief complaint of " reevaluation of chest pain."  His past medical history and cardiovascular risk factors are: Hypertension, hyperlipidemia, prediabetes, obesity.   Patient was originally referred to the office for evaluation of chest pain and hypertension. Patient is accompanied by his daughter at today's office visit.  Patient is [place] descent and his daughter is providing majority of the history of present illness.  At the last office visit, he mentioned that he had  2 episodes of chest pain since January 2021.  Discomfort was located substernally, lasted for a few seconds, nonradiating, not associated with effort related activities noted to resolve with rest. The pain is usually self-limited and happens randomly. He was recommended to undergo an echocardiogram and nuclear stress test to further risk stratification.  However, patient was not able to keep the appointment for the testing.   Since the last visit he has not had any chest pain or shortness of breath.   His BP is not at goal but he is contributing this to attending a graduation party yesterday and had salty foods and etc. He is re-encouraged to keep a log off his BP at home and bring it in at the next visit.   No premature coronary disease in the family or sudden cardiac death.  Denies prior history of coronary artery disease, myocardial infarction, congestive heart failure, deep venous thrombosis, pulmonary embolism, stroke, transient ischemic attack.  FUNCTIONAL STATUS: He tries to walk 30 mins 4x per week.   ALLERGIES: No  Known Allergies  MEDICATION LIST PRIOR TO VISIT: No outpatient medications have been marked as taking for the 12/26/19 encounter (Office Visit) with Terri Skains, Johnatha Zeidman, DO.     PAST MEDICAL HISTORY: Past Medical History:  Diagnosis Date   Hyperlipidemia    Hypertension    Pre-diabetes    Tuberculosis     PAST SURGICAL HISTORY: History reviewed. No pertinent surgical history.  FAMILY HISTORY: The patient family history includes Healthy in his brother, brother, sister, sister, sister, sister, and sister; Hypertension in his father; Thyroid disease in his mother and sister.  SOCIAL HISTORY:  The patient  reports that he has never smoked. He has never used smokeless tobacco. He reports current alcohol use. He reports that he does not use drugs.  REVIEW OF SYSTEMS: Review of Systems  Constitution: Negative for chills and fever.  HENT: Negative for hoarse voice and nosebleeds.   Eyes: Negative for discharge, double vision and pain.  Cardiovascular: Negative for chest pain, claudication, dyspnea on exertion, leg swelling, near-syncope, orthopnea, palpitations, paroxysmal nocturnal dyspnea and syncope.  Respiratory: Negative for hemoptysis and shortness of breath.   Musculoskeletal: Negative for muscle cramps and myalgias.  Gastrointestinal: Negative for abdominal pain, constipation, diarrhea, hematemesis, hematochezia, melena, nausea and vomiting.  Neurological: Negative for dizziness and light-headedness.    PHYSICAL EXAM: Vitals with BMI 12/26/2019 12/14/2019 12/14/2019  Height 5\' 7"  - 5\' 7"   Weight 232 lbs 8 oz - 234 lbs 13 oz  BMI 97.41 - 63.84  Systolic 536 468 032  Diastolic 98 88 91  Pulse 69 - 66  CONSTITUTIONAL: Well-developed and well-nourished. No acute distress.  SKIN: Skin is warm and dry. No rash noted. No cyanosis. No pallor. No jaundice HEAD: Normocephalic and atraumatic.  EYES: No scleral icterus MOUTH/THROAT: Moist oral membranes.  NECK: No JVD present. No  thyromegaly noted. No carotid bruits  LYMPHATIC: No visible cervical adenopathy.  CHEST Normal respiratory effort. No intercostal retractions  LUNGS: Clear to auscultation bilaterally.  No stridor. No wheezes. No rales.  CARDIOVASCULAR: Regular rate and rhythm, positive S1-S2, no murmurs rubs or gallops appreciated ABDOMINAL: Obese, soft, nontender, nondistended, positive bowel sounds all 4 quadrants. No apparent ascites.  EXTREMITIES: No peripheral edema  HEMATOLOGIC: No significant bruising NEUROLOGIC: Oriented to person, place, and time. Nonfocal. Normal muscle tone.  PSYCHIATRIC: Normal mood and affect. Normal behavior. Cooperative  CARDIAC DATABASE: EKG: 11/24/2019: Normal sinus rhythm, 71 bpm, normal axis, no underlying ischemia or injury pattern.   Echocardiogram: None  Stress Testing: None  Heart Catheterization: None  LABORATORY DATA: CBC Latest Ref Rng & Units 05/24/2019 11/05/2018 05/01/2017  WBC 3.4 - 10.8 x10E3/uL 9.4 9.7 9.0  Hemoglobin 13.0 - 17.7 g/dL 16.1 09.6 04.5  Hematocrit 37.5 - 51.0 % 46.6 45.1 46.1  Platelets 150 - 450 x10E3/uL 301 300 316    CMP Latest Ref Rng & Units 11/02/2019 05/24/2019 11/05/2018  Glucose 65 - 99 mg/dL 96 92 98  BUN 6 - 24 mg/dL 12 15 14   Creatinine 0.76 - 1.27 mg/dL 4.09 8.11  Sodium 134 - 144 mmol/L 139 135 139  Potassium 3.5 - 5.2 mmol/L 4.6 4.3 3.9  Chloride 96 - 106 mmol/L 104 100 103  CO2 20 - 29 mmol/L 22 - -  Calcium 8.7 - 10.2 mg/dL 9.14 9.9 9.3  Total Protein 6.0 - 8.5 g/dL 7.4 7.7 7.2  Total Bilirubin 0.0 - 1.2 mg/dL 0.4 0.5 0.5  Alkaline Phos 39 - 117 IU/L 82 78 93  AST 0 - 40 IU/L 27 23 38  ALT 0 - 44 IU/L 49(H) 43 70(H)    Lipid Panel     Component Value Date/Time   CHOL 219 (H) 11/02/2019 1114   TRIG 154 (H) 11/02/2019 1114   HDL 43 11/02/2019 1114   CHOLHDL 5.1 (H) 11/02/2019 1114   CHOLHDL 6.8 (H) 03/21/2015 2107   VLDL 60 (H) 03/21/2015 2107   LDLCALC 148 (H) 11/02/2019 1114   LABVLDL 28 11/02/2019  1114    Lab Results  Component Value Date   HGBA1C 6.2 (H) 11/02/2019   HGBA1C 5.8 (H) 05/24/2019   HGBA1C 6.1 (H) 11/05/2018   No components found for: NTPROBNP Lab Results  Component Value Date   TSH 1.290 05/24/2019   TSH 1.830 11/05/2018   TSH 1.650 05/01/2017    BMP Recent Labs    05/24/19 1517 11/02/19 1114  NA 135 139  K 4.3 4.6  CL 100 104  CO2  --  22  GLUCOSE 92 96  BUN 15 12  CREATININE 0.94 0.97  CALCIUM 9.9 10.2  GFRNONAA 98 94  GFRAA 113 109   HEMOGLOBIN A1C Lab Results  Component Value Date   HGBA1C 6.2 (H) 11/02/2019   MPG 120 (H) 03/21/2015    IMPRESSION:  No diagnosis found.   RECOMMENDATIONS: Adrian Alvarez is a 44 y.o. male whose past medical history and cardiac risk factors include: Hypertension, hyperlipidemia, prediabetes, obesity.  Precordial chest pain: Stable   Echocardiogram will be ordered to evaluate for structural heart disease and left ventricular systolic function.    Nuclear stress  test recommended to evaluate for reversible ischemia.    Patient is educated on the importance of risk factor modifications.  His recent lipid profile is not at goal recommended lifestyle modifications and even considering pharmacological therapy.    Patient is also prediabetic, educated on importance of better glycemic control.    And his blood pressures are not well controlled.  Clinically he appears to have features of sleep apnea and may benefit from sleep evaluation as well.    Benign essential hypertension:I have asked him to keep a log of his blood pressures and to bring it into the next office visit for review. Until educated on the importance of low salt diet.   Hyperlipidemia, mixed: Would recommend a combination of lifestyle changes and may consider pharmacological therapy if clinically warranted.  Will defer to PCP.  Obesity, due to excess calories: Body mass index is 36.41 kg/m.  I reviewed with the patient the importance of  diet, regular physical activity/exercise, weight loss.    Patient is educated on increasing physical activity gradually as tolerated.  With the goal of moderate intensity exercise for 30 minutes a day 5 days a week.  FINAL MEDICATION LIST END OF ENCOUNTER: No orders of the defined types were placed in this encounter.    Current Outpatient Medications:    amLODipine (NORVASC) 10 MG tablet, Take 1 tablet (10 mg total) by mouth daily., Disp: 90 tablet, Rfl: 1   Multiple Vitamin (MULTIVITAMIN) tablet, Take 1 tablet by mouth daily., Disp: , Rfl:   No orders of the defined types were placed in this encounter.  --Continue cardiac medications as reconciled in final medication list. --Return in about 4 weeks (around 01/23/2020) for review test results.. Or sooner if needed. --Continue follow-up with your primary care physician regarding the management of your other chronic comorbid conditions.  Patient's questions and concerns were addressed to his satisfaction. He voices understanding of the instructions provided during this encounter.   This note was created using a voice recognition software as a result there may be grammatical errors inadvertently enclosed that do not reflect the nature of this encounter. Every attempt is made to correct such errors.  Tessa Lerner, [place], [facility]  Pager: [phone] Office: [phone]

## 2020-01-16 ENCOUNTER — Other Ambulatory Visit: Payer: Self-pay

## 2020-01-16 ENCOUNTER — Ambulatory Visit: Payer: No Typology Code available for payment source

## 2020-01-16 ENCOUNTER — Encounter: Payer: Self-pay | Admitting: Family Medicine

## 2020-01-16 DIAGNOSIS — R072 Precordial pain: Secondary | ICD-10-CM

## 2020-01-16 DIAGNOSIS — R7303 Prediabetes: Secondary | ICD-10-CM

## 2020-01-24 ENCOUNTER — Other Ambulatory Visit: Payer: Self-pay

## 2020-01-24 ENCOUNTER — Encounter: Payer: Self-pay | Admitting: Cardiology

## 2020-01-24 ENCOUNTER — Ambulatory Visit: Payer: No Typology Code available for payment source | Admitting: Cardiology

## 2020-01-24 VITALS — BP 123/93 | HR 78 | Ht 67.0 in | Wt 229.2 lb

## 2020-01-24 DIAGNOSIS — Z712 Person consulting for explanation of examination or test findings: Secondary | ICD-10-CM

## 2020-01-24 DIAGNOSIS — I10 Essential (primary) hypertension: Secondary | ICD-10-CM

## 2020-01-24 DIAGNOSIS — E6609 Other obesity due to excess calories: Secondary | ICD-10-CM

## 2020-01-24 DIAGNOSIS — E782 Mixed hyperlipidemia: Secondary | ICD-10-CM

## 2020-01-24 DIAGNOSIS — R7303 Prediabetes: Secondary | ICD-10-CM

## 2020-01-24 DIAGNOSIS — R072 Precordial pain: Secondary | ICD-10-CM

## 2020-01-24 NOTE — Progress Notes (Signed)
Adrian Alvarez Date of Birth: 10/28/1975 MRN: [id number] Primary Care Provider:Greene, Asencion Partridge, MD Primary Cardiologist: Tessa Lerner, DO, [facility] (established care 11/24/2019)  Date: 01/24/20 Last Office Visit: 12/26/2019  Chief Complaint  Patient presents with  . Chest Pain  . Follow-up    4 week    HPI  Adrian Alvarez is a 44 y.o. male who presents to the office with a  chief complaint of " reevaluation of chest pain and review test results."  His past medical history and cardiovascular risk factors are: Hypertension, hyperlipidemia, prediabetes, obesity.   Patient was originally referred to the office for evaluation of chest pain and hypertension. Patient is accompanied by his daughter at today's office visit.  Patient is [place] descent and his daughter is providing majority of the history of present illness.  Since last office visit patient has not had any symptoms of chest discomfort.  He is undergone ischemic evaluation including an echocardiogram and nuclear stress test for further stratification.  The echocardiogram noted preserved left ventricular systolic and diastolic function.  An exercise sestamibi stress test noted good functional capacity for age, patient achieved 11.78 METS, and myocardial perfusion is normal.  Overall low risk study.  The results were reviewed in great detail with both the patient and his daughter at today's office visit.  Benign essential hypertension: Patient states that his blood pressure is well controlled since being on amlodipine 10 mg p.o. daily.  Home blood pressure readings are between 120-130 mmHg.  He is trying to consume a low-salt diet.  Other cardiovascular risk factors include hyperlipidemia.  Patient is currently working with his primary care physician lifestyle modifications and has an upcoming office visit to reevaluate fasting lipid profile and to see if pharmacological therapy is warranted.  No premature coronary disease in the family  or sudden cardiac death.  Denies prior history of coronary artery disease, myocardial infarction, congestive heart failure, deep venous thrombosis, pulmonary embolism, stroke, transient ischemic attack.  FUNCTIONAL STATUS: He tries to walk 30 mins 4x per week.   ALLERGIES: No Known Allergies  MEDICATION LIST PRIOR TO VISIT: Current Meds  Medication Sig  . amLODipine (NORVASC) 10 MG tablet Take 1 tablet (10 mg total) by mouth daily.  . Multiple Vitamin (MULTIVITAMIN) tablet Take 1 tablet by mouth daily.     PAST MEDICAL HISTORY: Past Medical History:  Diagnosis Date  . Hyperlipidemia   . Hypertension   . Pre-diabetes   . Tuberculosis     PAST SURGICAL HISTORY: History reviewed. No pertinent surgical history.  FAMILY HISTORY: The patient family history includes Healthy in his brother, brother, sister, sister, sister, sister, and sister; Hypertension in his father; Thyroid disease in his mother and sister.  SOCIAL HISTORY:  The patient  reports that he has never smoked. He has never used smokeless tobacco. He reports current alcohol use. He reports that he does not use drugs.  REVIEW OF SYSTEMS: Review of Systems  Constitutional: Negative for chills and fever.  HENT: Negative for hoarse voice and nosebleeds.   Eyes: Negative for discharge, double vision and pain.  Cardiovascular: Negative for chest pain, claudication, dyspnea on exertion, leg swelling, near-syncope, orthopnea, palpitations, paroxysmal nocturnal dyspnea and syncope.  Respiratory: Negative for hemoptysis and shortness of breath.   Musculoskeletal: Negative for muscle cramps and myalgias.  Gastrointestinal: Negative for abdominal pain, constipation, diarrhea, hematemesis, hematochezia, melena, nausea and vomiting.  Neurological: Negative for dizziness and light-headedness.    PHYSICAL EXAM: Vitals with BMI 01/24/2020 12/26/2019 12/14/2019  Height 5\' 7"  5\' 7"  -  Weight 229 lbs 3 oz 232 lbs 8 oz -  BMI 35.89  36.41 -  Systolic 123 132  Diastolic 93 98 88  Pulse 78 69 -    CONSTITUTIONAL: Well-developed and well-nourished. No acute distress.  SKIN: Skin is warm and dry. No rash noted. No cyanosis. No pallor. No jaundice HEAD: Normocephalic and atraumatic.  EYES: No scleral icterus MOUTH/THROAT: Moist oral membranes.  NECK: No JVD present. No thyromegaly noted. No carotid bruits  LYMPHATIC: No visible cervical adenopathy.  CHEST Normal respiratory effort. No intercostal retractions  LUNGS: Clear to auscultation bilaterally.  No stridor. No wheezes. No rales.  CARDIOVASCULAR: Regular rate and rhythm, positive S1-S2, no murmurs rubs or gallops appreciated ABDOMINAL: Obese, soft, nontender, nondistended, positive bowel sounds all 4 quadrants. No apparent ascites.  EXTREMITIES: No peripheral edema  HEMATOLOGIC: No significant bruising NEUROLOGIC: Oriented to person, place, and time. Nonfocal. Normal muscle tone.  PSYCHIATRIC: Normal mood and affect. Normal behavior. Cooperative  CARDIAC DATABASE: EKG: 11/24/2019: Normal sinus rhythm, 71 bpm, normal axis, no underlying ischemia or injury pattern.   Echocardiogram: 01/16/2020: LVEF 65%, normal diastolic filling pattern, mild TR, no pulmonary hypertension.  Stress Testing: Exercise Sestamibi Stress Test 01/16/2020:  Normal ECG stress. The patient exercised for 9 minutes and 59 seconds of a Bruce protocol, achieving approximately 11.78 METs. Normal BP response. Stress terminated due to THR and fatigue achieved.  Myocardial perfusion is normal.  Overall LV systolic function is normal without regional wall motion abnormalities.  Stress LV EF: 65%.  No previous exam available for comparison. Low risk.   Heart Catheterization: None  LABORATORY DATA: CBC Latest Ref Rng & Units 05/24/2019 11/05/2018 05/01/2017  WBC 3.4 - 10.8 x10E3/uL 9.4 9.7 9.0  Hemoglobin 13.0 - 17.7 g/dL 11/07/2018 07/01/2017 69.6  Hematocrit 37.5 - 51.0 % 46.6 45.1 46.1  Platelets  150 - 450 x10E3/uL 301 300 316    CMP Latest Ref Rng & Units 11/02/2019 05/24/2019 11/05/2018  Glucose 65 - 99 mg/dL 96 92 98  BUN 6 - 24 mg/dL 12 15 14   Creatinine 0.76 - 1.27 mg/dL 13/09/2018 11/07/2018  Sodium 134 - 144 mmol/L 139 135 139  Potassium 3.5 - 5.2 mmol/L 4.6 4.3 3.9  Chloride 96 - 106 mmol/L 104 100 103  CO2 20 - 29 mmol/L 22 - -  Calcium 8.7 - 10.2 mg/dL 1.32 9.9 9.3  Total Protein 6.0 - 8.5 g/dL 7.4 7.7 7.2  Total Bilirubin 0.0 - 1.2 mg/dL 0.4 0.5 0.5  Alkaline Phos 39 - 117 IU/L 82 78 93  AST 0 - 40 IU/L 27 23 38  ALT 0 - 44 IU/L 49(H) 43 70(H)    Lipid Panel     Component Value Date/Time   CHOL 219 (H) 11/02/2019 1114   TRIG 154 (H) 11/02/2019 1114   HDL 43 11/02/2019 1114   CHOLHDL 5.1 (H) 11/02/2019 1114   CHOLHDL 6.8 (H) 03/21/2015 2107   VLDL 60 (H) 03/21/2015 2107   LDLCALC 148 (H) 11/02/2019 1114   LABVLDL 28 11/02/2019 1114    Lab Results  Component Value Date   HGBA1C 6.2 (H) 11/02/2019   HGBA1C 5.8 (H) 05/24/2019   HGBA1C 6.1 (H) 11/05/2018   No components found for: NTPROBNP Lab Results  Component Value Date   TSH 1.290 05/24/2019   TSH 1.830 11/05/2018   TSH 1.650 05/01/2017    BMP Recent Labs    05/24/19 1517 11/02/19 1114  NA 135  139  K 4.3 4.6  CL 100 104  CO2  --  22  GLUCOSE 92 96  BUN 15 12  CREATININE 0.94 0.97  CALCIUM 9.9 10.2  GFRNONAA 98 94  GFRAA 113 109   HEMOGLOBIN A1C Lab Results  Component Value Date   HGBA1C 6.2 (H) 11/02/2019   MPG 120 (H) 03/21/2015    IMPRESSION:    ICD-10-CM   1. Encounter to discuss test results  Z71.2   2. Precordial chest pain  R07.2   3. Benign hypertension  I10   4. Mixed hyperlipidemia  E78.2   5. Prediabetes  R73.03   6. Class 2 obesity due to excess calories without serious comorbidity with body mass index (BMI) of 35.0 to 35.9 in adult  E66.09    Z68.35      RECOMMENDATIONS: Adrian Alvarez is a 44 y.o. male whose past medical history and cardiac risk factors include:  Hypertension, hyperlipidemia, prediabetes, obesity.  Precordial chest pain: Resolved.   Reviewed the results of the echocardiogram and nuclear stress test with the patient and his daughter at today's office visit.  Additional details noted above.  These test results were reviewed independently.    Educated on importance of improving his modifiable cardiovascular risk factors including management of hypertension and hyperlipidemia.  Educated on importance of low-salt diet and exercising 30 minutes a day 5 days a week.    Clinically he appears to have features of sleep apnea and may benefit from sleep evaluation as well.    Benign essential hypertension: Improving. Daughter states that the home SBP ranges between 120-121mmHg. Educated on the importance of low salt diet.   Hyperlipidemia, mixed: Would recommend a combination of lifestyle changes and may consider pharmacological therapy if clinically warranted.  Will defer to PCP. Daughter states that he has repeat blood work with PCP and if its not well controlled they will consider pharmacological therapy.   Obesity, due to excess calories: Body mass index is 35.9 kg/m.  Congratulated on losing 4 pounds since the last visit.  . I reviewed with the patient the importance of diet, regular physical activity/exercise, weight loss.   . Patient is educated on increasing physical activity gradually as tolerated.  With the goal of moderate intensity exercise for 30 minutes a day 5 days a week.  FINAL MEDICATION LIST END OF ENCOUNTER: No orders of the defined types were placed in this encounter.    Current Outpatient Medications:  .  amLODipine (NORVASC) 10 MG tablet, Take 1 tablet (10 mg total) by mouth daily., Disp: 90 tablet, Rfl: 1 .  Multiple Vitamin (MULTIVITAMIN) tablet, Take 1 tablet by mouth daily., Disp: , Rfl:   No orders of the defined types were placed in this encounter.  --Continue cardiac medications as reconciled in final  medication list. --Return in about 1 year (around 01/23/2021) for re-evaluation of symptoms and risk factors. Or sooner if needed. --Continue follow-up with your primary care physician regarding the management of your other chronic comorbid conditions.  Patient's questions and concerns were addressed to his satisfaction. He voices understanding of the instructions provided during this encounter.   This note was created using a voice recognition software as a result there may be grammatical errors inadvertently enclosed that do not reflect the nature of this encounter. Every attempt is made to correct such errors.  Tessa Lerner, [place], [facility]  Pager: [phone] Office: [phone]

## 2020-05-16 ENCOUNTER — Encounter: Payer: Self-pay | Admitting: Family Medicine

## 2020-05-16 ENCOUNTER — Other Ambulatory Visit: Payer: Self-pay

## 2020-05-16 ENCOUNTER — Ambulatory Visit: Payer: PRIVATE HEALTH INSURANCE | Admitting: Family Medicine

## 2020-05-16 VITALS — BP 132/94 | HR 73 | Temp 98.5°F | Ht 67.0 in | Wt 229.0 lb

## 2020-05-16 DIAGNOSIS — E785 Hyperlipidemia, unspecified: Secondary | ICD-10-CM

## 2020-05-16 DIAGNOSIS — Z1159 Encounter for screening for other viral diseases: Secondary | ICD-10-CM

## 2020-05-16 DIAGNOSIS — R7303 Prediabetes: Secondary | ICD-10-CM

## 2020-05-16 DIAGNOSIS — I10 Essential (primary) hypertension: Secondary | ICD-10-CM

## 2020-05-16 MED ORDER — AMLODIPINE BESYLATE 10 MG PO TABS: 10.0000 mg | ORAL_TABLET | Freq: Every day | ORAL | 1 refills | Status: DC

## 2020-05-16 MED ORDER — HYDROCHLOROTHIAZIDE 12.5 MG PO TABS: 12.5000 mg | ORAL_TABLET | Freq: Every day | ORAL | 1 refills | Status: DC

## 2020-05-16 NOTE — Patient Instructions (Addendum)
See info on prediabetes. Low intensity exercise most days per week, goal of 150 min per week. Avoid sugar containing beverages, fast food as much as possible.   Add new blood pressure medicine once per day. If new side effects, let me know. Recheck blood pressure in 3 months.   Return to the clinic or go to the nearest emergency room if any of your symptoms worsen or new symptoms occur.    Managing Your Hypertension Hypertension is commonly called high blood pressure. This is when the force of your blood pressing against the walls of your arteries is too strong. Arteries are blood vessels that carry blood from your heart throughout your body. Hypertension forces the heart to work harder to pump blood, and may cause the arteries to become narrow or stiff. Having untreated or uncontrolled hypertension can cause heart attack, stroke, kidney disease, and other problems. What are blood pressure readings? A blood pressure reading consists of a higher number over a lower number. Ideally, your blood pressure should be below 120/80. The first ("top") number is called the systolic pressure. It is a measure of the pressure in your arteries as your heart beats. The second ("bottom") number is called the diastolic pressure. It is a measure of the pressure in your arteries as the heart relaxes. What does my blood pressure reading mean? Blood pressure is classified into four stages. Based on your blood pressure reading, your health care provider may use the following stages to determine what type of treatment you need, if any. Systolic pressure and diastolic pressure are measured in a unit called mm Hg. Normal  Systolic pressure: below 120.  Diastolic pressure: below 80. Elevated  Systolic pressure: 120-129.  Diastolic pressure: below 80. Hypertension stage 1  Systolic pressure: 130-139.  Diastolic pressure: 80-89. Hypertension stage 2  Systolic pressure: 140 or above.  Diastolic pressure: 90  or above. What health risks are associated with hypertension? Managing your hypertension is an important responsibility. Uncontrolled hypertension can lead to:  A heart attack.  A stroke.  A weakened blood vessel (aneurysm).  Heart failure.  Kidney damage.  Eye damage.  Metabolic syndrome.  Memory and concentration problems. What changes can I make to manage my hypertension? Hypertension can be managed by making lifestyle changes and possibly by taking medicines. Your health care provider will help you make a plan to bring your blood pressure within a normal range. Eating and drinking   Eat a diet that is high in fiber and potassium, and low in salt (sodium), added sugar, and fat. An example eating plan is called the DASH (Dietary Approaches to Stop Hypertension) diet. To eat this way: ? Eat plenty of fresh fruits and vegetables. Try to fill half of your plate at each meal with fruits and vegetables. ? Eat whole grains, such as whole wheat pasta, brown rice, or whole grain bread. Fill about one quarter of your plate with whole grains. ? Eat low-fat diary products. ? Avoid fatty cuts of meat, processed or cured meats, and poultry with skin. Fill about one quarter of your plate with lean proteins such as fish, chicken without skin, beans, eggs, and tofu. ? Avoid premade and processed foods. These tend to be higher in sodium, added sugar, and fat.  Reduce your daily sodium intake. Most people with hypertension should eat less than 1,500 mg of sodium a day.  Limit alcohol intake to no more than 1 drink a day for nonpregnant women and 2 drinks a day  for men. One drink equals 12 oz of beer, 5 oz of wine, or 1 oz of hard liquor. Lifestyle  Work with your health care provider to maintain a healthy body weight, or to lose weight. Ask what an ideal weight is for you.  Get at least 30 minutes of exercise that causes your heart to beat faster (aerobic exercise) most days of the week.  Activities may include walking, swimming, or biking.  Include exercise to strengthen your muscles (resistance exercise), such as weight lifting, as part of your weekly exercise routine. Try to do these types of exercises for 30 minutes at least 3 days a week.  Do not use any products that contain nicotine or tobacco, such as cigarettes and e-cigarettes. If you need help quitting, ask your health care provider.  Control any long-term (chronic) conditions you have, such as high cholesterol or diabetes. Monitoring  Monitor your blood pressure at home as told by your health care provider. Your personal target blood pressure may vary depending on your medical conditions, your age, and other factors.  Have your blood pressure checked regularly, as often as told by your health care provider. Working with your health care provider  Review all the medicines you take with your health care provider because there may be side effects or interactions.  Talk with your health care provider about your diet, exercise habits, and other lifestyle factors that may be contributing to hypertension.  Visit your health care provider regularly. Your health care provider can help you create and adjust your plan for managing hypertension. Will I need medicine to control my blood pressure? Your health care provider may prescribe medicine if lifestyle changes are not enough to get your blood pressure under control, and if:  Your systolic blood pressure is 130 or higher.  Your diastolic blood pressure is 80 or higher. Take medicines only as told by your health care provider. Follow the directions carefully. Blood pressure medicines must be taken as prescribed. The medicine does not work as well when you skip doses. Skipping doses also puts you at risk for problems. Contact a health care provider if:  You think you are having a reaction to medicines you have taken.  You have repeated (recurrent) headaches.  You feel  dizzy.  You have swelling in your ankles.  You have trouble with your vision. Get help right away if:  You develop a severe headache or confusion.  You have unusual weakness or numbness, or you feel faint.  You have severe pain in your chest or abdomen.  You vomit repeatedly.  You have trouble breathing. Summary  Hypertension is when the force of blood pumping through your arteries is too strong. If this condition is not controlled, it may put you at risk for serious complications.  Your personal target blood pressure may vary depending on your medical conditions, your age, and other factors. For most people, a normal blood pressure is less than 120/80.  Hypertension is managed by lifestyle changes, medicines, or both. Lifestyle changes include weight loss, eating a healthy, low-sodium diet, exercising more, and limiting alcohol. This information is not intended to replace advice given to you by your health care provider. Make sure you discuss any questions you have with your health care provider. Document Revised: 10/29/2018 Document Reviewed: 06/04/2016 Elsevier Patient Education  2020 [employer].  Prediabetes Prediabetes is the condition of having a blood sugar (blood glucose) level that is higher than it should be, but not high enough for  you to be diagnosed with type 2 diabetes. Having prediabetes puts you at risk for developing type 2 diabetes (type 2 diabetes mellitus). Prediabetes may be called impaired glucose tolerance or impaired fasting glucose. Prediabetes usually does not cause symptoms. Your health care provider can diagnose this condition with blood tests. You may be tested for prediabetes if you are overweight and if you have at least one other risk factor for prediabetes. What is blood glucose, and how is it measured? Blood glucose refers to the amount of glucose in your bloodstream. Glucose comes from eating foods that contain sugars and starches (carbohydrates),  which the body breaks down into glucose. Your blood glucose level may be measured in mg/dL (milligrams per deciliter) or mmol/L (millimoles per liter). Your blood glucose may be checked with one or more of the following blood tests:  A fasting blood glucose (FBG) test. You will not be allowed to eat (you will fast) for 8 hours or longer before a blood sample is taken. ? A normal range for FBG is 70-100 mg/dl (0.3-4.7 mmol/L).  An A1c (hemoglobin A1c) blood test. This test provides information about blood glucose control over the previous 2?63months.  An oral glucose tolerance test (OGTT). This test measures your blood glucose at two times: ? After fasting. This is your baseline level. ? Two hours after you drink a beverage that contains glucose. You may be diagnosed with prediabetes:  If your FBG is 100?125 mg/dL (4.2-5.9 mmol/L).  If your A1c level is 5.7?6.4%.  If your OGTT result is 140?199 mg/dL (5.6-38 mmol/L). These blood tests may be repeated to confirm your diagnosis. How can this condition affect me? The pancreas produces a hormone (insulin) that helps to move glucose from the bloodstream into cells. When cells in the body do not respond properly to insulin that the body makes (insulin resistance), excess glucose builds up in the blood instead of going into cells. As a result, high blood glucose (hyperglycemia) can develop, which can cause many complications. Hyperglycemia is a symptom of prediabetes. Having high blood glucose for a long time is dangerous. Too much glucose in your blood can damage your nerves and blood vessels. Long-term damage can lead to complications from diabetes, which may include:  Heart disease.  Stroke.  Blindness.  Kidney disease.  Depression.  Poor circulation in the feet and legs, which could lead to surgical removal (amputation) in severe cases. What can increase my risk? Risk factors for prediabetes include:  Having a family member with type  2 diabetes.  Being overweight or obese.  Being older than age 5.  Being of American [place], African-American, Hispanic/Latino, or Asian/Pacific Islander descent.  Having an inactive (sedentary) lifestyle.  Having a history of heart disease.  History of gestational diabetes or polycystic ovary syndrome (PCOS), in women.  Having low levels of good cholesterol (HDL-C) or high levels of blood fats (triglycerides).  Having high blood pressure. What actions can I take to prevent diabetes?      Be physically active. ? Do moderate-intensity physical activity for 30 or more minutes on 5 or more days of the week, or as much as told by your health care provider. This could be brisk walking, biking, or water aerobics. ? Ask your health care provider what activities are safe for you. A mix of physical activities may be best, such as walking, swimming, cycling, and strength training.  Lose weight as told by your health care provider. ? Losing 5-7% of your body weight can  reverse insulin resistance. ? Your health care provider can determine how much weight loss is best for you and can help you lose weight safely.  Follow a healthy meal plan. This includes eating lean proteins, complex carbohydrates, fresh fruits and vegetables, low-fat dairy products, and healthy fats. ? Follow instructions from your health care provider about eating or drinking restrictions. ? Make an appointment to see a diet and nutrition specialist (registered dietitian) to help you create a healthy eating plan that is right for you.  Do not smoke or use any tobacco products, such as cigarettes, chewing tobacco, and e-cigarettes. If you need help quitting, ask your health care provider.  Take over-the-counter and prescription medicines as told by your health care provider. You may be prescribed medicines that help lower the risk of type 2 diabetes.  Keep all follow-up visits as told by your health care provider. This is  important. Summary  Prediabetes is the condition of having a blood sugar (blood glucose) level that is higher than it should be, but not high enough for you to be diagnosed with type 2 diabetes.  Having prediabetes puts you at risk for developing type 2 diabetes (type 2 diabetes mellitus).  To help prevent type 2 diabetes, make lifestyle changes such as being physically active and eating a healthy diet. Lose weight as told by your health care provider. This information is not intended to replace advice given to you by your health care provider. Make sure you discuss any questions you have with your health care provider. Document Revised: 10/29/2018 Document Reviewed: 08/28/2015 Elsevier Patient Education  [employer].    If you have lab work done today you will be contacted with your lab results within the next 2 weeks.  If you have not heard from [place] then please contact us. The fastest way to get your results is to register for My Chart.   IF you received an x-ray today, you will receive an invoice from [facility]. Please contact [facility] at [phone] with questions or concerns regarding your invoice.   IF you received labwork today, you will receive an invoice from [facility]. Please contact [facility] at [phone] with questions or concerns regarding your invoice.   Our billing staff will not be able to assist you with questions regarding bills from these companies.  You will be contacted with the lab results as soon as they are available. The fastest way to get your results is to activate your My Chart account. Instructions are located on the last page of this paperwork. If you have not heard from [place] regarding the results in 2 weeks, please contact this office.

## 2020-05-16 NOTE — Progress Notes (Signed)
Subjective:  Patient ID: Adrian Alvarez, male    DOB: 1976/01/04  Age: 44 y.o. MRN: [id number]  CC:  Chief Complaint  Patient presents with  . Follow-up    on preidiabetes and hypertension. Pt reports no physical symptoms oh hypertension, but has gotten high BP readings at home rangeing from 130's-140's and bottom number always in the 90's. Pt isn't watching diet/ sugar intake.     HPI Adrian Alvarez presents for   Prediabetes: No significant change to his diet or sugar intake since last visit.  Weight down from June, stable from July. Exercise 2 days per week - walk/jog at times.  Soda/sweet tea - at times. Intermittent.  Fast food few times. Per week.   Wt Readings from Last 3 Encounters:  05/16/20 229 lb (103.9 kg)  01/24/20 229 lb 3.2 oz (104 kg)  12/26/19 232 lb 8 oz (105.5 kg)    Lab Results  Component Value Date   HGBA1C 6.2 (H) 11/02/2019   HGBA1C 5.8 (H) 05/24/2019   HGBA1C 6.1 (H) 11/05/2018   Lab Results  Component Value Date   LDLCALC 148 (H) 11/02/2019   CREATININE 0.97 11/02/2019    Hyperlipidemia: Diet/exercise approach initially planned, no current meds. Fasting. Min sugar in coffee.  The 10-year ASCVD risk score Denman George DC Montez Hageman., et al., 2013) is: 3.9%   Values used to calculate the score:     Age: 44 years     Sex: Male     Is Non-Hispanic African American: No     Diabetic: No     Tobacco smoker: No     Systolic Blood Pressure: 132 mmHg     Is BP treated: Yes     HDL Cholesterol: 43 mg/dL     Total Cholesterol: 219 mg/dL  Lab Results  Component Value Date   CHOL 219 (H) 11/02/2019   HDL 43 11/02/2019   LDLCALC 148 (H) 11/02/2019   TRIG 154 (H) 11/02/2019   CHOLHDL 5.1 (H) 11/02/2019   Lab Results  Component Value Date   ALT 49 (H) 11/02/2019   AST 27 11/02/2019   GGT 71 (H) 05/24/2019   ALKPHOS 82 11/02/2019   BILITOT 0.4 11/02/2019    Hypertension: Amlodipine 10 mg daily. Home readings: Home readings in the 130s to 140s over  90s. BP Readings from Last 3 Encounters:  05/16/20 (!) 132/94  01/24/20 (!) 123/93  12/26/19 (!) 132/98   Lab Results  Component Value Date   CREATININE 0.97 11/02/2019     History There are no problems to display for this patient.  Past Medical History:  Diagnosis Date  . Hyperlipidemia   . Hypertension   . Pre-diabetes   . Tuberculosis    No past surgical history on file. No Known Allergies Prior to Admission medications   Medication Sig Start Date End Date Taking? Authorizing Provider  amLODipine (NORVASC) 10 MG tablet Take 1 tablet (10 mg total) by mouth daily. 12/14/19   Shade Flood, MD  Multiple Vitamin (MULTIVITAMIN) tablet Take 1 tablet by mouth daily.    [provider]   Social History   Socioeconomic History  . Marital status: Married    Spouse name: Not on file  . Number of children: 1  . Years of education: Not on file  . Highest education level: Not on file  Occupational History  . Not on file  Tobacco Use  . Smoking status: Never Smoker  . Smokeless tobacco: Never Used  Vaping Use  .  Vaping Use: Never used  Substance and Sexual Activity  . Alcohol use: Yes    Alcohol/week: 0.0 standard drinks    Comment: occ  . Drug use: No  . Sexual activity: Not on file  Other Topics Concern  . Not on file  Social History Narrative  . Not on file   Social Determinants of Health   Financial Resource Strain:   . Difficulty of Paying Living Expenses: Not on file  Food Insecurity:   . Worried About Programme researcher, broadcasting/film/video in the Last Year: Not on file  . Ran Out of Food in the Last Year: Not on file  Transportation Needs:   . Lack of Transportation (Medical): Not on file  . Lack of Transportation (Non-Medical): Not on file  Physical Activity:   . Days of Exercise per Week: Not on file  . Minutes of Exercise per Session: Not on file  Stress:   . Feeling of Stress : Not on file  Social Connections:   . Frequency of Communication with Friends  and Family: Not on file  . Frequency of Social Gatherings with Friends and Family: Not on file  . Attends Religious Services: Not on file  . Active Member of Clubs or Organizations: Not on file  . Attends Banker Meetings: Not on file  . Marital Status: Not on file  Intimate Partner Violence:   . Fear of Current or Ex-Partner: Not on file  . Emotionally Abused: Not on file  . Physically Abused: Not on file  . Sexually Abused: Not on file    Review of Systems   Objective:   Vitals:   05/16/20 0910 05/16/20 0914  BP: (!) 141/95 (!) 132/94  Pulse: 73   Temp: 98.5 F (36.9 C)   TempSrc: Temporal   SpO2: 98%   Weight: 229 lb (103.9 kg)   Height: 5\' 7"  (1.702 m)      Physical Exam Vitals reviewed.  Constitutional:      Appearance: He is well-developed. He is obese.  HENT:     Head: Normocephalic and atraumatic.  Eyes:     Pupils: Pupils are equal, round, and reactive to light.  Neck:     Vascular: No carotid bruit or JVD.  Cardiovascular:     Rate and Rhythm: Normal rate and regular rhythm.     Heart sounds: Normal heart sounds. No murmur heard.   Pulmonary:     Effort: Pulmonary effort is normal.     Breath sounds: Normal breath sounds. No rales.  Skin:    General: Skin is warm and dry.  Neurological:     Mental Status: He is alert and oriented to person, place, and time.        Assessment & Plan:  Adrian Alvarez is a 44 y.o. male . Essential hypertension - Plan: amLODipine (NORVASC) 10 MG tablet, hydrochlorothiazide (HYDRODIURIL) 12.5 MG tablet  - add hctz 12. 5mg  qd for improved control. Continue amlodipine 10mg  qd.   Prediabetes - Plan: Hemoglobin A1c  -Dietary advice given, handout given on management.  Commended on his exercise but increase to 150 minutes/week.  Check A1c, recheck A1c in 3 to 6 months.  Hyperlipidemia, unspecified hyperlipidemia type - Plan: Comprehensive metabolic panel, Lipid panel  -Previous low ASCVD risk score,  diet/exercise recommendations given.  Check labs.  Need for hepatitis C screening test - Plan: Hepatitis C antibody   Meds ordered this encounter  Medications  . amLODipine (NORVASC) 10 MG tablet  Sig: Take 1 tablet (10 mg total) by mouth daily.    Dispense:  90 tablet    Refill:  1  . hydrochlorothiazide (HYDRODIURIL) 12.5 MG tablet    Sig: Take 1 tablet (12.5 mg total) by mouth daily.    Dispense:  90 tablet    Refill:  1   Patient Instructions     See info on prediabetes. Low intensity exercise most days per week, goal of 150 min per week. Avoid sugar containing beverages, fast food as much as possible.   Add new blood pressure medicine once per day. If new side effects, let me know. Recheck blood pressure in 3 months.   Return to the clinic or go to the nearest emergency room if any of your symptoms worsen or new symptoms occur.    Managing Your Hypertension Hypertension is commonly called high blood pressure. This is when the force of your blood pressing against the walls of your arteries is too strong. Arteries are blood vessels that carry blood from your heart throughout your body. Hypertension forces the heart to work harder to pump blood, and may cause the arteries to become narrow or stiff. Having untreated or uncontrolled hypertension can cause heart attack, stroke, kidney disease, and other problems. What are blood pressure readings? A blood pressure reading consists of a higher number over a lower number. Ideally, your blood pressure should be below 120/80. The first ("top") number is called the systolic pressure. It is a measure of the pressure in your arteries as your heart beats. The second ("bottom") number is called the diastolic pressure. It is a measure of the pressure in your arteries as the heart relaxes. What does my blood pressure reading mean? Blood pressure is classified into four stages. Based on your blood pressure reading, your health care provider may  use the following stages to determine what type of treatment you need, if any. Systolic pressure and diastolic pressure are measured in a unit called mm Hg. Normal  Systolic pressure: below 120.  Diastolic pressure: below 80. Elevated  Systolic pressure: 120-129.  Diastolic pressure: below 80. Hypertension stage 1  Systolic pressure: 130-139.  Diastolic pressure: 80-89. Hypertension stage 2  Systolic pressure: 140 or above.  Diastolic pressure: 90 or above. What health risks are associated with hypertension? Managing your hypertension is an important responsibility. Uncontrolled hypertension can lead to:  A heart attack.  A stroke.  A weakened blood vessel (aneurysm).  Heart failure.  Kidney damage.  Eye damage.  Metabolic syndrome.  Memory and concentration problems. What changes can I make to manage my hypertension? Hypertension can be managed by making lifestyle changes and possibly by taking medicines. Your health care provider will help you make a plan to bring your blood pressure within a normal range. Eating and drinking   Eat a diet that is high in fiber and potassium, and low in salt (sodium), added sugar, and fat. An example eating plan is called the DASH (Dietary Approaches to Stop Hypertension) diet. To eat this way: ? Eat plenty of fresh fruits and vegetables. Try to fill half of your plate at each meal with fruits and vegetables. ? Eat whole grains, such as whole wheat pasta, brown rice, or whole grain bread. Fill about one quarter of your plate with whole grains. ? Eat low-fat diary products. ? Avoid fatty cuts of meat, processed or cured meats, and poultry with skin. Fill about one quarter of your plate with lean proteins such as fish, chicken  without skin, beans, eggs, and tofu. ? Avoid premade and processed foods. These tend to be higher in sodium, added sugar, and fat.  Reduce your daily sodium intake. Most people with hypertension should eat less  than 1,500 mg of sodium a day.  Limit alcohol intake to no more than 1 drink a day for nonpregnant women and 2 drinks a day for men. One drink equals 12 oz of beer, 5 oz of wine, or 1 oz of hard liquor. Lifestyle  Work with your health care provider to maintain a healthy body weight, or to lose weight. Ask what an ideal weight is for you.  Get at least 30 minutes of exercise that causes your heart to beat faster (aerobic exercise) most days of the week. Activities may include walking, swimming, or biking.  Include exercise to strengthen your muscles (resistance exercise), such as weight lifting, as part of your weekly exercise routine. Try to do these types of exercises for 30 minutes at least 3 days a week.  Do not use any products that contain nicotine or tobacco, such as cigarettes and e-cigarettes. If you need help quitting, ask your health care provider.  Control any long-term (chronic) conditions you have, such as high cholesterol or diabetes. Monitoring  Monitor your blood pressure at home as told by your health care provider. Your personal target blood pressure may vary depending on your medical conditions, your age, and other factors.  Have your blood pressure checked regularly, as often as told by your health care provider. Working with your health care provider  Review all the medicines you take with your health care provider because there may be side effects or interactions.  Talk with your health care provider about your diet, exercise habits, and other lifestyle factors that may be contributing to hypertension.  Visit your health care provider regularly. Your health care provider can help you create and adjust your plan for managing hypertension. Will I need medicine to control my blood pressure? Your health care provider may prescribe medicine if lifestyle changes are not enough to get your blood pressure under control, and if:  Your systolic blood pressure is 130 or  higher.  Your diastolic blood pressure is 80 or higher. Take medicines only as told by your health care provider. Follow the directions carefully. Blood pressure medicines must be taken as prescribed. The medicine does not work as well when you skip doses. Skipping doses also puts you at risk for problems. Contact a health care provider if:  You think you are having a reaction to medicines you have taken.  You have repeated (recurrent) headaches.  You feel dizzy.  You have swelling in your ankles.  You have trouble with your vision. Get help right away if:  You develop a severe headache or confusion.  You have unusual weakness or numbness, or you feel faint.  You have severe pain in your chest or abdomen.  You vomit repeatedly.  You have trouble breathing. Summary  Hypertension is when the force of blood pumping through your arteries is too strong. If this condition is not controlled, it may put you at risk for serious complications.  Your personal target blood pressure may vary depending on your medical conditions, your age, and other factors. For most people, a normal blood pressure is less than 120/80.  Hypertension is managed by lifestyle changes, medicines, or both. Lifestyle changes include weight loss, eating a healthy, low-sodium diet, exercising more, and limiting alcohol. This information is not intended  to replace advice given to you by your health care provider. Make sure you discuss any questions you have with your health care provider. Document Revised: 10/29/2018 Document Reviewed: 06/04/2016 Elsevier Patient Education  2020 [employer].  Prediabetes Prediabetes is the condition of having a blood sugar (blood glucose) level that is higher than it should be, but not high enough for you to be diagnosed with type 2 diabetes. Having prediabetes puts you at risk for developing type 2 diabetes (type 2 diabetes mellitus). Prediabetes may be called impaired glucose  tolerance or impaired fasting glucose. Prediabetes usually does not cause symptoms. Your health care provider can diagnose this condition with blood tests. You may be tested for prediabetes if you are overweight and if you have at least one other risk factor for prediabetes. What is blood glucose, and how is it measured? Blood glucose refers to the amount of glucose in your bloodstream. Glucose comes from eating foods that contain sugars and starches (carbohydrates), which the body breaks down into glucose. Your blood glucose level may be measured in mg/dL (milligrams per deciliter) or mmol/L (millimoles per liter). Your blood glucose may be checked with one or more of the following blood tests:  A fasting blood glucose (FBG) test. You will not be allowed to eat (you will fast) for 8 hours or longer before a blood sample is taken. ? A normal range for FBG is 70-100 mg/dl (0.9-8.1 mmol/L).  An A1c (hemoglobin A1c) blood test. This test provides information about blood glucose control over the previous 2?3months.  An oral glucose tolerance test (OGTT). This test measures your blood glucose at two times: ? After fasting. This is your baseline level. ? Two hours after you drink a beverage that contains glucose. You may be diagnosed with prediabetes:  If your FBG is 100?125 mg/dL (1.9-1.4 mmol/L).  If your A1c level is 5.7?6.4%.  If your OGTT result is 140?199 mg/dL (7.8-29 mmol/L). These blood tests may be repeated to confirm your diagnosis. How can this condition affect me? The pancreas produces a hormone (insulin) that helps to move glucose from the bloodstream into cells. When cells in the body do not respond properly to insulin that the body makes (insulin resistance), excess glucose builds up in the blood instead of going into cells. As a result, high blood glucose (hyperglycemia) can develop, which can cause many complications. Hyperglycemia is a symptom of prediabetes. Having high blood  glucose for a long time is dangerous. Too much glucose in your blood can damage your nerves and blood vessels. Long-term damage can lead to complications from diabetes, which may include:  Heart disease.  Stroke.  Blindness.  Kidney disease.  Depression.  Poor circulation in the feet and legs, which could lead to surgical removal (amputation) in severe cases. What can increase my risk? Risk factors for prediabetes include:  Having a family member with type 2 diabetes.  Being overweight or obese.  Being older than age 68.  Being of American [place], African-American, Hispanic/Latino, or Asian/Pacific Islander descent.  Having an inactive (sedentary) lifestyle.  Having a history of heart disease.  History of gestational diabetes or polycystic ovary syndrome (PCOS), in women.  Having low levels of good cholesterol (HDL-C) or high levels of blood fats (triglycerides).  Having high blood pressure. What actions can I take to prevent diabetes?      Be physically active. ? Do moderate-intensity physical activity for 30 or more minutes on 5 or more days of the week, or as  much as told by your health care provider. This could be brisk walking, biking, or water aerobics. ? Ask your health care provider what activities are safe for you. A mix of physical activities may be best, such as walking, swimming, cycling, and strength training.  Lose weight as told by your health care provider. ? Losing 5-7% of your body weight can reverse insulin resistance. ? Your health care provider can determine how much weight loss is best for you and can help you lose weight safely.  Follow a healthy meal plan. This includes eating lean proteins, complex carbohydrates, fresh fruits and vegetables, low-fat dairy products, and healthy fats. ? Follow instructions from your health care provider about eating or drinking restrictions. ? Make an appointment to see a diet and nutrition specialist (registered  dietitian) to help you create a healthy eating plan that is right for you.  Do not smoke or use any tobacco products, such as cigarettes, chewing tobacco, and e-cigarettes. If you need help quitting, ask your health care provider.  Take over-the-counter and prescription medicines as told by your health care provider. You may be prescribed medicines that help lower the risk of type 2 diabetes.  Keep all follow-up visits as told by your health care provider. This is important. Summary  Prediabetes is the condition of having a blood sugar (blood glucose) level that is higher than it should be, but not high enough for you to be diagnosed with type 2 diabetes.  Having prediabetes puts you at risk for developing type 2 diabetes (type 2 diabetes mellitus).  To help prevent type 2 diabetes, make lifestyle changes such as being physically active and eating a healthy diet. Lose weight as told by your health care provider. This information is not intended to replace advice given to you by your health care provider. Make sure you discuss any questions you have with your health care provider. Document Revised: 10/29/2018 Document Reviewed: 08/28/2015 Elsevier Patient Education  [employer]2020 [employer]    If you have lab work done today you will be contacted with your lab results within the next 2 weeks.  If you have not heard from us then please contact us. The fastest way to get your results is to register for My Chart.   IF you received an x-ray today, you will receive an invoice from [facility]. Please contact [facility] at [phone] with questions or concerns regarding your invoice.   IF you received labwork today, you will receive an invoice from [facility]. Please contact [facility] at [phone] with questions or concerns regarding your invoice.   Our billing staff will not be able to assist you with questions regarding bills from these companies.  You will be contacted with  the lab results as soon as they are available. The fastest way to get your results is to activate your My Chart account. Instructions are located on the last page of this paperwork. If you have not heard from us regarding the results in 2 weeks, please contact this office.         Signed, Meredith StaggersJeffrey Novice Vrba, MD [facility]

## 2020-05-17 LAB — COMPREHENSIVE METABOLIC PANEL
ALT: 38 IU/L (ref 0–44)
AST: 24 IU/L (ref 0–40)
Albumin/Globulin Ratio: 1.9 (ref 1.2–2.2)
Albumin: 5.1 g/dL — ABNORMAL HIGH (ref 4.0–5.0)
Alkaline Phosphatase: 83 IU/L (ref 44–121)
BUN/Creatinine Ratio: 11 (ref 9–20)
BUN: 9 mg/dL (ref 6–24)
Bilirubin Total: 0.5 mg/dL (ref 0.0–1.2)
CO2: 24 mmol/L (ref 20–29)
Calcium: 10.1 mg/dL (ref 8.7–10.2)
Chloride: 102 mmol/L (ref 96–106)
Creatinine, Ser: 0.82 mg/dL (ref 0.76–1.27)
GFR calc Af Amer: 123 mL/min/{1.73_m2} (ref >59)
GFR calc non Af Amer: 106 mL/min/{1.73_m2} (ref >59)
Globulin, Total: 2.7 g/dL (ref 1.5–4.5)
Glucose: 95 mg/dL (ref 65–99)
Potassium: 4.5 mmol/L (ref 3.5–5.2)
Sodium: 139 mmol/L (ref 134–144)
Total Protein: 7.8 g/dL (ref 6.0–8.5)

## 2020-05-17 LAB — LIPID PANEL
Chol/HDL Ratio: 5.4 ratio — ABNORMAL HIGH (ref 0.0–5.0)
Cholesterol, Total: 234 mg/dL — ABNORMAL HIGH (ref 100–199)
HDL: 43 mg/dL (ref >39)
LDL Chol Calc (NIH): 164 mg/dL — ABNORMAL HIGH (ref 0–99)
Triglycerides: 149 mg/dL (ref 0–149)
VLDL Cholesterol Cal: 27 mg/dL (ref 5–40)

## 2020-05-17 LAB — HEMOGLOBIN A1C
Est. average glucose Bld gHb Est-mCnc: 126 mg/dL
Hgb A1c MFr Bld: 6 % — ABNORMAL HIGH (ref 4.8–5.6)

## 2020-05-17 LAB — HEPATITIS C ANTIBODY: Hep C Virus Ab: <0.1 s/co ratio (ref 0.0–0.9)

## 2020-08-16 ENCOUNTER — Encounter: Payer: Self-pay | Admitting: Family Medicine

## 2020-08-16 ENCOUNTER — Telehealth: Payer: Self-pay | Admitting: Family Medicine

## 2020-08-16 ENCOUNTER — Ambulatory Visit: Payer: PRIVATE HEALTH INSURANCE | Admitting: Family Medicine

## 2020-08-16 ENCOUNTER — Other Ambulatory Visit: Payer: Self-pay

## 2020-08-16 VITALS — BP 144/78 | HR 88 | Temp 97.9°F | Ht 67.0 in | Wt 235.0 lb

## 2020-08-16 DIAGNOSIS — I10 Essential (primary) hypertension: Secondary | ICD-10-CM

## 2020-08-16 DIAGNOSIS — Z6836 Body mass index (BMI) 36.0-36.9, adult: Secondary | ICD-10-CM

## 2020-08-16 DIAGNOSIS — E785 Hyperlipidemia, unspecified: Secondary | ICD-10-CM | POA: Diagnosis not present

## 2020-08-16 DIAGNOSIS — R7303 Prediabetes: Secondary | ICD-10-CM

## 2020-08-16 LAB — BASIC METABOLIC PANEL
BUN/Creatinine Ratio: 14 (ref 9–20)
BUN: 12 mg/dL (ref 6–24)
CO2: 22 mmol/L (ref 20–29)
Calcium: 9.9 mg/dL (ref 8.7–10.2)
Chloride: 103 mmol/L (ref 96–106)
Creatinine, Ser: 0.88 mg/dL (ref 0.76–1.27)
GFR calc Af Amer: 119 mL/min/{1.73_m2} (ref >59)
GFR calc non Af Amer: 103 mL/min/{1.73_m2} (ref >59)
Glucose: 87 mg/dL (ref 65–99)
Potassium: 4.2 mmol/L (ref 3.5–5.2)
Sodium: 139 mmol/L (ref 134–144)

## 2020-08-16 MED ORDER — AMLODIPINE BESYLATE 10 MG PO TABS: 10.0000 mg | ORAL_TABLET | Freq: Every day | ORAL | 1 refills | Status: DC

## 2020-08-16 MED ORDER — HYDROCHLOROTHIAZIDE 12.5 MG PO TABS: 12.5000 mg | ORAL_TABLET | Freq: Every day | ORAL | 1 refills | Status: DC

## 2020-08-16 NOTE — Telephone Encounter (Signed)
Called patients Daughter to let her know her fathers Rx was ready for pick up at our office ,

## 2020-08-16 NOTE — Progress Notes (Signed)
Subjective:  Patient ID: Adrian Alvarez, male    DOB: 12-24-1975  Age: 45 y.o. MRN: [id number]  CC:  Chief Complaint  Patient presents with  . Follow-up    On hypertension. Pt reports no issues with home with BP. BP home readings have been within normal range.  Pt reports no physical symptoms of this conditions.     HPI Adrian Alvarez presents for  Hypertension: Amlodipine 10 mg daily, HCTZ 12.5 mg daily -added last visit.  No new side effects, some increased urination initially. Has some salt in his food. ` Home readings: 130/90.  BP Readings from Last 3 Encounters:  08/16/20 (!) 144/78  05/16/20 (!) 132/94  01/24/20 (!) 123/93   Lab Results  Component Value Date   CREATININE 0.82 05/16/2020   Prediabetes: Dietary advice given last visit.  Weight has increased since October. Some difficulty with exercise. Still drinking sodas, plans to decrease sodas.  Alcohol - rare.  Fast food: some.  Lab Results  Component Value Date   HGBA1C 6.0 (H) 05/16/2020   Wt Readings from Last 3 Encounters:  08/16/20 235 lb (106.6 kg)  05/16/20 229 lb (103.9 kg)  01/24/20 229 lb 3.2 oz (104 kg)   Hyperlipidemia: No current statin. The 10-year ASCVD risk score Denman George DC Montez Hageman., et al., 2013) is: 5.1%   Values used to calculate the score:     Age: 57 years     Sex: Male     Is Non-Hispanic African American: No     Diabetic: No     Tobacco smoker: No     Systolic Blood Pressure: 144 mmHg     Is BP treated: Yes     HDL Cholesterol: 43 mg/dL     Total Cholesterol: 234 mg/dL    Lab Results  Component Value Date   CHOL 234 (H) 05/16/2020   HDL 43 05/16/2020   LDLCALC 164 (H) 05/16/2020   TRIG 149 05/16/2020   CHOLHDL 5.4 (H) 05/16/2020   Lab Results  Component Value Date   ALT 38 05/16/2020   AST 24 05/16/2020   GGT 71 (H) 05/24/2019   ALKPHOS 83 05/16/2020   BILITOT 0.5 05/16/2020      History There are no problems to display for this patient.  Past Medical History:   Diagnosis Date  . Hyperlipidemia   . Hypertension   . Pre-diabetes   . Tuberculosis    No past surgical history on file. No Known Allergies Prior to Admission medications   Medication Sig Start Date End Date Taking? Authorizing Provider  amLODipine (NORVASC) 10 MG tablet Take 1 tablet (10 mg total) by mouth daily. 05/16/20  Yes Shade Flood, MD  hydrochlorothiazide (HYDRODIURIL) 12.5 MG tablet Take 1 tablet (12.5 mg total) by mouth daily. 05/16/20  Yes Shade Flood, MD  Multiple Vitamin (MULTIVITAMIN) tablet Take 1 tablet by mouth daily.   Yes [provider]   Social History   Socioeconomic History  . Marital status: Married    Spouse name: Not on file  . Number of children: 1  . Years of education: Not on file  . Highest education level: Not on file  Occupational History  . Not on file  Tobacco Use  . Smoking status: Never Smoker  . Smokeless tobacco: Never Used  Vaping Use  . Vaping Use: Never used  Substance and Sexual Activity  . Alcohol use: Yes    Alcohol/week: 0.0 standard drinks    Comment: occ  . Drug  use: No  . Sexual activity: Not on file  Other Topics Concern  . Not on file  Social History Narrative  . Not on file   Social Determinants of Health   Financial Resource Strain: Not on file  Food Insecurity: Not on file  Transportation Needs: Not on file  Physical Activity: Not on file  Stress: Not on file  Social Connections: Not on file  Intimate Partner Violence: Not on file    Review of Systems  Constitutional: Negative for fatigue and unexpected weight change.  Eyes: Negative for visual disturbance.  Respiratory: Negative for cough, chest tightness and shortness of breath.   Cardiovascular: Negative for chest pain, palpitations and leg swelling.  Gastrointestinal: Negative for abdominal pain and blood in stool.  Neurological: Negative for dizziness, light-headedness and headaches.     Objective:   Vitals:   08/16/20  0904 08/16/20 0948  BP: (!) 144/90 (!) 144/78  Pulse: 88   Temp: 97.9 F (36.6 C)   TempSrc: Temporal   SpO2: 98%   Weight: 235 lb (106.6 kg)   Height: 5\' 7"  (1.702 m)      Physical Exam Vitals reviewed.  Constitutional:      Appearance: He is well-developed and well-nourished.  HENT:     Head: Normocephalic and atraumatic.  Eyes:     Extraocular Movements: EOM normal.     Pupils: Pupils are equal, round, and reactive to light.  Neck:     Vascular: No carotid bruit or JVD.  Cardiovascular:     Rate and Rhythm: Normal rate and regular rhythm.     Heart sounds: Normal heart sounds. No murmur heard.   Pulmonary:     Effort: Pulmonary effort is normal.     Breath sounds: Normal breath sounds. No rales.  Musculoskeletal:        General: No edema.  Skin:    General: Skin is warm and dry.  Neurological:     Mental Status: He is alert and oriented to person, place, and time.  Psychiatric:        Mood and Affect: Mood and affect normal.      Assessment & Plan:  Adrian Alvarez is a 45 y.o. male . Prediabetes  -Dietary advice given.  Cut back on sodas, fast food.  Essential hypertension - Plan: Basic metabolic panel, amLODipine (NORVASC) 10 MG tablet, hydrochlorothiazide (HYDRODIURIL) 12.5 MG tablet  -Plans to decrease sodium diet, borderline control present, borderline at home.  Anticipate decrease sodium may be sufficient, continue HCTZ, amlodipine same dose.  Recheck 3 months  Hyperlipidemia, unspecified hyperlipidemia type BMI 36.0-36.9,adult  -Low ASCVD risk score. hold on  statin for now, recheck levels at next visit.  Diet, weight loss discussed.  Meds ordered this encounter  Medications  . amLODipine (NORVASC) 10 MG tablet    Sig: Take 1 tablet (10 mg total) by mouth daily.    Dispense:  90 tablet    Refill:  1  . hydrochlorothiazide (HYDRODIURIL) 12.5 MG tablet    Sig: Take 1 tablet (12.5 mg total) by mouth daily.    Dispense:  90 tablet    Refill:  1    Patient Instructions    Try to cut back on soda and fast food. Walking is great exercise - goal of 150 minutes per week.   If you can cut back on salt in diet, can stay on same meds for now. Recheck in 3 months.    If you have lab work done today  you will be contacted with your lab results within the next 2 weeks.  If you have not heard from [place] then please contact us. The fastest way to get your results is to register for My Chart.   IF you received an x-ray today, you will receive an invoice from [facility]. Please contact [facility] at [phone] with questions or concerns regarding your invoice.   IF you received labwork today, you will receive an invoice from [facility]. Please contact [facility] at [phone] with questions or concerns regarding your invoice.   Our billing staff will not be able to assist you with questions regarding bills from these companies.  You will be contacted with the lab results as soon as they are available. The fastest way to get your results is to activate your My Chart account. Instructions are located on the last page of this paperwork. If you have not heard from [place] regarding the results in 2 weeks, please contact this office.         Signed, Meredith Staggers, MD [facility]

## 2020-08-16 NOTE — Patient Instructions (Addendum)
  Try to cut back on soda and fast food. Walking is great exercise - goal of 150 minutes per week.   If you can cut back on salt in diet, can stay on same meds for now. Recheck in 3 months.    If you have lab work done today you will be contacted with your lab results within the next 2 weeks.  If you have not heard from [place] then please contact us. The fastest way to get your results is to register for My Chart.   IF you received an x-ray today, you will receive an invoice from [facility]. Please contact [facility] at [phone] with questions or concerns regarding your invoice.   IF you received labwork today, you will receive an invoice from [facility]. Please contact [facility] at [phone] with questions or concerns regarding your invoice.   Our billing staff will not be able to assist you with questions regarding bills from these companies.  You will be contacted with the lab results as soon as they are available. The fastest way to get your results is to activate your My Chart account. Instructions are located on the last page of this paperwork. If you have not heard from [place] regarding the results in 2 weeks, please contact this office.

## 2020-11-06 ENCOUNTER — Telehealth: Payer: Self-pay | Admitting: *Deleted

## 2020-11-06 ENCOUNTER — Encounter: Payer: Self-pay | Admitting: *Deleted

## 2020-11-06 DIAGNOSIS — U071 COVID-19: Secondary | ICD-10-CM

## 2020-11-06 NOTE — Telephone Encounter (Signed)
RN notified by HR that pt left work early today not feeling well. Spoke with pt by phone.  Cough, nasal congestion, chill, low grade 99.3, body aches. Has been taking Tylenol, Robitussin at home. Advised Phenylephrine, Mucinex, nasal saline as mist and nasal rinse in shower.  Day 0 4/18 Day 5 4/23 RTW 4/24, next scheduled workday 4/25 Testing scheduled for Covid and flu 4/20 at [facility] [address]

## 2020-11-06 NOTE — Telephone Encounter (Signed)
Reviewed RN Haley note agreed with plan of care. 

## 2020-11-08 NOTE — Telephone Encounter (Signed)
Spoke with pt by phone. He reports sx started resolving yesterday, and he feels very well today. Did complete covid and flu testing yesterday 4/20. Plan for f/u Friday for anticipated results and determination on spouse and household family member testing.

## 2020-11-08 NOTE — Telephone Encounter (Signed)
Reviewed RN Rolly Salter note and agreed with plan of care; test results pending and symptoms improving.

## 2020-11-09 NOTE — Telephone Encounter (Signed)
Spoke with pt by phone. He confirms covid test was positive. All sx are resolved and he is ready to RTW Monday as planned. Discussed strict mask use thru 4/28. Also advised rest of household continue quarantine and get tested. They are. Denies further questions or concerns.

## 2020-11-11 NOTE — Telephone Encounter (Signed)
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 Patient to wear mask when around family members at home, isolate and sanitize high touch surfaces daily through day 10 or until symptoms resolve.  Spouse verbalized understanding information/instructions, agreed with plan of care and had no further questions at this time. HR notified patient cleared to return onsite wearing mask through 11/15/20.

## 2020-11-12 NOTE — Telephone Encounter (Signed)
Reviewed RN Rolly Salter note; patient RTW onsite as expected.

## 2020-11-12 NOTE — Telephone Encounter (Signed)
Confirmed with supervisor that pt did RTW today as expected. Closing encounter.

## 2020-11-15 ENCOUNTER — Ambulatory Visit: Payer: Self-pay | Admitting: Family Medicine

## 2020-11-23 ENCOUNTER — Ambulatory Visit: Payer: Self-pay | Admitting: Family Medicine

## 2020-11-26 ENCOUNTER — Other Ambulatory Visit: Payer: Self-pay

## 2020-11-26 ENCOUNTER — Encounter: Payer: Self-pay | Admitting: Family Medicine

## 2020-11-26 ENCOUNTER — Ambulatory Visit: Payer: PRIVATE HEALTH INSURANCE | Admitting: Family Medicine

## 2020-11-26 VITALS — BP 134/76 | HR 86 | Temp 98.2°F | Resp 17 | Ht 67.0 in | Wt 237.4 lb

## 2020-11-26 DIAGNOSIS — I10 Essential (primary) hypertension: Secondary | ICD-10-CM | POA: Diagnosis not present

## 2020-11-26 DIAGNOSIS — R7303 Prediabetes: Secondary | ICD-10-CM | POA: Diagnosis not present

## 2020-11-26 MED ORDER — HYDROCHLOROTHIAZIDE 12.5 MG PO TABS: 12.5000 mg | ORAL_TABLET | Freq: Every day | ORAL | 1 refills | Status: DC

## 2020-11-26 MED ORDER — AMLODIPINE BESYLATE 10 MG PO TABS: 10.0000 mg | ORAL_TABLET | Freq: Every day | ORAL | 1 refills | Status: DC

## 2020-11-26 NOTE — Progress Notes (Signed)
Subjective:  Patient ID: Adrian Alvarez, male    DOB: 11/25/1975  Age: 45 y.o. MRN: [id number]  CC:  Chief Complaint  Patient presents with  . Diabetes    Pt reports has modified diet and started walking for exercise.     HPI Adrian Alvarez presents for   Prediabetes: As above he has adjusted his diet - avoiding fatty foods. Increased walking for exercise - 4-5 days per week.  Lipids slightly elevated but low ASCVD score 4.3 in October.  No current statin. Has bloodwork at company in few days. had coffee with sugar today.    Lab Results  Component Value Date   HGBA1C 6.0 (H) 05/16/2020   Wt Readings from Last 3 Encounters:  11/26/20 237 lb 6.4 oz (107.7 kg)  08/16/20 235 lb (106.6 kg)  05/16/20 229 lb (103.9 kg)    Hypertension: Amlodipine 10 mg daily, hydrochlorothiazide 12.5 mg daily. No new side effects with meds.  Home readings: unknown but variable.  BP Readings from Last 3 Encounters:  11/26/20 134/76  08/16/20 (!) 144/78  05/16/20 (!) 132/94   Lab Results  Component Value Date   CREATININE 0.88 08/16/2020      History There are no problems to display for this patient.  Past Medical History:  Diagnosis Date  . Hyperlipidemia   . Hypertension   . Pre-diabetes   . Tuberculosis    No past surgical history on file. No Known Allergies Prior to Admission medications   Medication Sig Start Date End Date Taking? Authorizing Provider  amLODipine (NORVASC) 10 MG tablet Take 1 tablet (10 mg total) by mouth daily. 08/16/20  Yes Shade Flood, MD  hydrochlorothiazide (HYDRODIURIL) 12.5 MG tablet Take 1 tablet (12.5 mg total) by mouth daily. 08/16/20  Yes Shade Flood, MD  Multiple Vitamin (MULTIVITAMIN) tablet Take 1 tablet by mouth daily.   Yes [provider]   Social History   Socioeconomic History  . Marital status: Married    Spouse name: Not on file  . Number of children: 1  . Years of education: Not on file  . Highest education  level: Not on file  Occupational History  . Not on file  Tobacco Use  . Smoking status: Never Smoker  . Smokeless tobacco: Never Used  Vaping Use  . Vaping Use: Never used  Substance and Sexual Activity  . Alcohol use: Yes    Alcohol/week: 0.0 standard drinks    Comment: occ  . Drug use: No  . Sexual activity: Not on file  Other Topics Concern  . Not on file  Social History Narrative  . Not on file   Social Determinants of Health   Financial Resource Strain: Not on file  Food Insecurity: Not on file  Transportation Needs: Not on file  Physical Activity: Not on file  Stress: Not on file  Social Connections: Not on file  Intimate Partner Violence: Not on file    Review of Systems  Constitutional: Negative for fatigue and unexpected weight change.  Eyes: Negative for visual disturbance.  Respiratory: Negative for cough, chest tightness and shortness of breath.   Cardiovascular: Negative for chest pain, palpitations and leg swelling.  Gastrointestinal: Negative for abdominal pain and blood in stool.  Neurological: Negative for dizziness, light-headedness and headaches.     Objective:   Vitals:   11/26/20 0802  BP: 134/76  Pulse: 86  Resp: 17  Temp: 98.2 F (36.8 C)  TempSrc: Temporal  SpO2: 98%  Weight:  237 lb 6.4 oz (107.7 kg)  Height: 5\' 7"  (1.702 m)     Physical Exam Vitals reviewed.  Constitutional:      Appearance: He is well-developed. He is obese.  HENT:     Head: Normocephalic and atraumatic.  Eyes:     Pupils: Pupils are equal, round, and reactive to light.  Neck:     Vascular: No carotid bruit or JVD.  Cardiovascular:     Rate and Rhythm: Normal rate and regular rhythm.     Heart sounds: Normal heart sounds. No murmur heard.   Pulmonary:     Effort: Pulmonary effort is normal.     Breath sounds: Normal breath sounds. No rales.  Skin:    General: Skin is warm and dry.  Neurological:     Mental Status: He is alert and oriented to  person, place, and time.        Assessment & Plan:  Adrian Alvarez is a 45 y.o. male . Prediabetes  -Slight increased weight, commended on diet and exercise changes.  Plan on A1c with his labs at work.  Handout given on prediabetes eating plan.  Recheck [id number]-month  Essential hypertension - Plan: amLODipine (NORVASC) 10 MG tablet, hydrochlorothiazide (HYDRODIURIL) 12.5 MG tablet  -Stable control, continue same regimen, labs planned at work.  Meds ordered this encounter  Medications  . amLODipine (NORVASC) 10 MG tablet    Sig: Take 1 tablet (10 mg total) by mouth daily.    Dispense:  90 tablet    Refill:  1  . hydrochlorothiazide (HYDRODIURIL) 12.5 MG tablet    Sig: Take 1 tablet (12.5 mg total) by mouth daily.    Dispense:  90 tablet    Refill:  1   Patient Instructions   No med changes today. continue to watch diet and see information below. Please have your company send me a copy of your bloodwork so I can see cholesterol, electrolytes, and prediabetes test. Thanks for coming in today and recheck in 6 months.   Prediabetes Eating Plan Prediabetes is a condition that causes blood sugar (glucose) levels to be higher than normal. This increases the risk for developing type 2 diabetes (type 2 diabetes mellitus). Working with a health care provider or nutrition specialist (dietitian) to make diet and lifestyle changes can help prevent the onset of diabetes. These changes may help you:  Control your blood glucose levels.  Improve your cholesterol levels.  Manage your blood pressure. What are tips for following this plan? Reading food labels  Read food labels to check the amount of fat, salt (sodium), and sugar in prepackaged foods. Avoid foods that have: ? Saturated fats. ? Trans fats. ? Added sugars.  Avoid foods that have more than 300 milligrams (mg) of sodium per serving. Limit your sodium intake to less than 2,300 mg each day. Shopping  Avoid buying pre-made and processed  foods.  Avoid buying drinks with added sugar. Cooking  Cook with olive oil. Do not use butter, lard, or ghee.  Bake, broil, grill, steam, or boil foods. Avoid frying. Meal planning  Work with your dietitian to create an eating plan that is right for you. This may include tracking how many calories you take in each day. Use a food diary, notebook, or mobile application to track what you eat at each meal.  Consider following a Mediterranean diet. This includes: ? Eating several servings of fresh fruits and vegetables each day. ? Eating fish at least twice a week. ? Eating  one serving each day of whole grains, beans, nuts, and seeds. ? Using olive oil instead of other fats. ? Limiting alcohol. ? Limiting red meat. ? Using nonfat or low-fat dairy products.  Consider following a plant-based diet. This includes dietary choices that focus on eating mostly vegetables and fruit, grains, beans, nuts, and seeds.  If you have high blood pressure, you may need to limit your sodium intake or follow a diet such as the DASH (Dietary Approaches to Stop Hypertension) eating plan. The DASH diet aims to lower high blood pressure.   Lifestyle  Set weight loss goals with help from your health care team. It is recommended that most people with prediabetes lose 7% of their body weight.  Exercise for at least 30 minutes 5 or more days a week.  Attend a support group or seek support from a mental health counselor.  Take over-the-counter and prescription medicines only as told by your health care provider. What foods are recommended? Fruits Berries. Bananas. Apples. Oranges. Grapes. Papaya. Mango. Pomegranate. Kiwi. Grapefruit. Cherries. Vegetables Lettuce. Spinach. Peas. Beets. Cauliflower. Cabbage. Broccoli. Carrots. Tomatoes. Squash. Eggplant. Herbs. Peppers. Onions. Cucumbers. Brussels sprouts. Grains Whole grains, such as whole-wheat or whole-grain breads, crackers, cereals, and pasta. Unsweetened  oatmeal. Bulgur. Barley. Quinoa. Brown rice. Corn or whole-wheat flour tortillas or taco shells. Meats and other proteins Seafood. Poultry without skin. Lean cuts of pork and beef. Tofu. Eggs. Nuts. Beans. Dairy Low-fat or fat-free dairy products, such as yogurt, cottage cheese, and cheese. Beverages Water. Tea. Coffee. Sugar-free or diet soda. Seltzer water. Low-fat or nonfat milk. Milk alternatives, such as soy or almond milk. Fats and oils Olive oil. Canola oil. Sunflower oil. Grapeseed oil. Avocado. Walnuts. Sweets and desserts Sugar-free or low-fat pudding. Sugar-free or low-fat ice cream and other frozen treats. Seasonings and condiments Herbs. Sodium-free spices. Mustard. Relish. Low-salt, low-sugar ketchup. Low-salt, low-sugar barbecue sauce. Low-fat or fat-free mayonnaise. The items listed above may not be a complete list of recommended foods and beverages. Contact a dietitian for more information. What foods are not recommended? Fruits Fruits canned with syrup. Vegetables Canned vegetables. Frozen vegetables with butter or cream sauce. Grains Refined white flour and flour products, such as bread, pasta, snack foods, and cereals. Meats and other proteins Fatty cuts of meat. Poultry with skin. Breaded or fried meat. Processed meats. Dairy Full-fat yogurt, cheese, or milk. Beverages Sweetened drinks, such as iced tea and soda. Fats and oils Butter. Lard. Ghee. Sweets and desserts Baked goods, such as cake, cupcakes, pastries, cookies, and cheesecake. Seasonings and condiments Spice mixes with added salt. Ketchup. Barbecue sauce. Mayonnaise. The items listed above may not be a complete list of foods and beverages that are not recommended. Contact a dietitian for more information. Where to find more information  American Diabetes Association: www.diabetes.org Summary  You may need to make diet and lifestyle changes to help prevent the onset of diabetes. These changes can  help you control blood sugar, improve cholesterol levels, and manage blood pressure.  Set weight loss goals with help from your health care team. It is recommended that most people with prediabetes lose 7% of their body weight.  Consider following a Mediterranean diet. This includes eating plenty of fresh fruits and vegetables, whole grains, beans, nuts, seeds, fish, and low-fat dairy, and using olive oil instead of other fats. This information is not intended to replace advice given to you by your health care provider. Make sure you discuss any questions you have with your health  care provider. Document Revised: 10/06/2019 Document Reviewed: 10/06/2019 Elsevier Patient Education  2021 Elsevier Inc.   Managing Your Hypertension Hypertension, also called high blood pressure, is when the force of the blood pressing against the walls of the arteries is too strong. Arteries are blood vessels that carry blood from your heart throughout your body. Hypertension forces the heart to work harder to pump blood and may cause the arteries to become narrow or stiff. Understanding blood pressure readings Your personal target blood pressure may vary depending on your medical conditions, your age, and other factors. A blood pressure reading includes a higher number over a lower number. Ideally, your blood pressure should be below 120/80. You should know that:  The first, or top, number is called the systolic pressure. It is a measure of the pressure in your arteries as your heart beats.  The second, or bottom number, is called the diastolic pressure. It is a measure of the pressure in your arteries as the heart relaxes. Blood pressure is classified into four stages. Based on your blood pressure reading, your health care provider may use the following stages to determine what type of treatment you need, if any. Systolic pressure and diastolic pressure are measured in a unit called mmHg. Normal  Systolic  pressure: below 120.  Diastolic pressure: below 80. Elevated  Systolic pressure: 120-129.  Diastolic pressure: below 80. Hypertension stage 1  Systolic pressure: 130-139.  Diastolic pressure: 80-89. Hypertension stage 2  Systolic pressure: 140 or above.  Diastolic pressure: 90 or above. How can this condition affect me? Managing your hypertension is an important responsibility. Over time, hypertension can damage the arteries and decrease blood flow to important parts of the body, including the brain, heart, and kidneys. Having untreated or uncontrolled hypertension can lead to:  A heart attack.  A stroke.  A weakened blood vessel (aneurysm).  Heart failure.  Kidney damage.  Eye damage.  Metabolic syndrome.  Memory and concentration problems.  Vascular dementia. What actions can I take to manage this condition? Hypertension can be managed by making lifestyle changes and possibly by taking medicines. Your health care provider will help you make a plan to bring your blood pressure within a normal range. Nutrition  Eat a diet that is high in fiber and potassium, and low in salt (sodium), added sugar, and fat. An example eating plan is called the Dietary Approaches to Stop Hypertension (DASH) diet. To eat this way: ? Eat plenty of fresh fruits and vegetables. Try to fill one-half of your plate at each meal with fruits and vegetables. ? Eat whole grains, such as whole-wheat pasta, brown rice, or whole-grain bread. Fill about one-fourth of your plate with whole grains. ? Eat low-fat dairy products. ? Avoid fatty cuts of meat, processed or cured meats, and poultry with skin. Fill about one-fourth of your plate with lean proteins such as fish, chicken without skin, beans, eggs, and tofu. ? Avoid pre-made and processed foods. These tend to be higher in sodium, added sugar, and fat.  Reduce your daily sodium intake. Most people with hypertension should eat less than 1,500 mg of  sodium a day.   Lifestyle  Work with your health care provider to maintain a healthy body weight or to lose weight. Ask what an ideal weight is for you.  Get at least 30 minutes of exercise that causes your heart to beat faster (aerobic exercise) most days of the week. Activities may include walking, swimming, or biking.  Include exercise  to strengthen your muscles (resistance exercise), such as weight lifting, as part of your weekly exercise routine. Try to do these types of exercises for 30 minutes at least 3 days a week.  Do not use any products that contain nicotine or tobacco, such as cigarettes, e-cigarettes, and chewing tobacco. If you need help quitting, ask your health care provider.  Control any long-term (chronic) conditions you have, such as high cholesterol or diabetes.  Identify your sources of stress and find ways to manage stress. This may include meditation, deep breathing, or making time for fun activities.   Alcohol use  Do not drink alcohol if: ? Your health care provider tells you not to drink. ? You are pregnant, may be pregnant, or are planning to become pregnant.  If you drink alcohol: ? Limit how much you use to:  0-1 drink a day for women.  0-2 drinks a day for men. ? Be aware of how much alcohol is in your drink. In the [place] one drink equals one 12 oz bottle of beer (355 mL), one 5 oz glass of wine (148 mL), or one 1 oz glass of hard liquor (44 mL). Medicines Your health care provider may prescribe medicine if lifestyle changes are not enough to get your blood pressure under control and if:  Your systolic blood pressure is 130 or higher.  Your diastolic blood pressure is 80 or higher. Take medicines only as told by your health care provider. Follow the directions carefully. Blood pressure medicines must be taken as told by your health care provider. The medicine does not work as well when you skip doses. Skipping doses also puts you at risk for  problems. Monitoring Before you monitor your blood pressure:  Do not smoke, drink caffeinated beverages, or exercise within 30 minutes before taking a measurement.  Use the bathroom and empty your bladder (urinate).  Sit quietly for at least 5 minutes before taking measurements. Monitor your blood pressure at home as told by your health care provider. To do this:  Sit with your back straight and supported.  Place your feet flat on the floor. Do not cross your legs.  Support your arm on a flat surface, such as a table. Make sure your upper arm is at heart level.  Each time you measure, take two or three readings one minute apart and record the results. You may also need to have your blood pressure checked regularly by your health care provider.   General information  Talk with your health care provider about your diet, exercise habits, and other lifestyle factors that may be contributing to hypertension.  Review all the medicines you take with your health care provider because there may be side effects or interactions.  Keep all visits as told by your health care provider. Your health care provider can help you create and adjust your plan for managing your high blood pressure. Where to find more information  National Heart, Lung, and Blood Institute: PopSteam.is  American Heart Association: www.heart.org Contact a health care provider if:  You think you are having a reaction to medicines you have taken.  You have repeated (recurrent) headaches.  You feel dizzy.  You have swelling in your ankles.  You have trouble with your vision. Get help right away if:  You develop a severe headache or confusion.  You have unusual weakness or numbness, or you feel faint.  You have severe pain in your chest or abdomen.  You vomit repeatedly.  You  have trouble breathing. These symptoms may represent a serious problem that is an emergency. Do not wait to see if the symptoms  will go away. Get medical help right away. Call your local emergency services (911 in the [place] Do not drive yourself to the hospital. Summary  Hypertension is when the force of blood pumping through your arteries is too strong. If this condition is not controlled, it may put you at risk for serious complications.  Your personal target blood pressure may vary depending on your medical conditions, your age, and other factors. For most people, a normal blood pressure is less than 120/80.  Hypertension is managed by lifestyle changes, medicines, or both.  Lifestyle changes to help manage hypertension include losing weight, eating a healthy, low-sodium diet, exercising more, stopping smoking, and limiting alcohol. This information is not intended to replace advice given to you by your health care provider. Make sure you discuss any questions you have with your health care provider. Document Revised: 08/12/2019 Document Reviewed: 06/07/2019 Elsevier Patient Education  2021 Elsevier Inc.      Signed, Meredith Staggers, MD [facility]

## 2020-11-26 NOTE — Patient Instructions (Addendum)
No med changes today. continue to watch diet and see information below. Please have your company send me a copy of your bloodwork so I can see cholesterol, electrolytes, and prediabetes test. Thanks for coming in today and recheck in 6 months.   Prediabetes Eating Plan Prediabetes is a condition that causes blood sugar (glucose) levels to be higher than normal. This increases the risk for developing type 2 diabetes (type 2 diabetes mellitus). Working with a health care provider or nutrition specialist (dietitian) to make diet and lifestyle changes can help prevent the onset of diabetes. These changes may help you:  Control your blood glucose levels.  Improve your cholesterol levels.  Manage your blood pressure. What are tips for following this plan? Reading food labels  Read food labels to check the amount of fat, salt (sodium), and sugar in prepackaged foods. Avoid foods that have: ? Saturated fats. ? Trans fats. ? Added sugars.  Avoid foods that have more than 300 milligrams (mg) of sodium per serving. Limit your sodium intake to less than 2,300 mg each day. Shopping  Avoid buying pre-made and processed foods.  Avoid buying drinks with added sugar. Cooking  Cook with olive oil. Do not use butter, lard, or ghee.  Bake, broil, grill, steam, or boil foods. Avoid frying. Meal planning  Work with your dietitian to create an eating plan that is right for you. This may include tracking how many calories you take in each day. Use a food diary, notebook, or mobile application to track what you eat at each meal.  Consider following a Mediterranean diet. This includes: ? Eating several servings of fresh fruits and vegetables each day. ? Eating fish at least twice a week. ? Eating one serving each day of whole grains, beans, nuts, and seeds. ? Using olive oil instead of other fats. ? Limiting alcohol. ? Limiting red meat. ? Using nonfat or low-fat dairy products.  Consider following a  plant-based diet. This includes dietary choices that focus on eating mostly vegetables and fruit, grains, beans, nuts, and seeds.  If you have high blood pressure, you may need to limit your sodium intake or follow a diet such as the DASH (Dietary Approaches to Stop Hypertension) eating plan. The DASH diet aims to lower high blood pressure.   Lifestyle  Set weight loss goals with help from your health care team. It is recommended that most people with prediabetes lose 7% of their body weight.  Exercise for at least 30 minutes 5 or more days a week.  Attend a support group or seek support from a mental health counselor.  Take over-the-counter and prescription medicines only as told by your health care provider. What foods are recommended? Fruits Berries. Bananas. Apples. Oranges. Grapes. Papaya. Mango. Pomegranate. Kiwi. Grapefruit. Cherries. Vegetables Lettuce. Spinach. Peas. Beets. Cauliflower. Cabbage. Broccoli. Carrots. Tomatoes. Squash. Eggplant. Herbs. Peppers. Onions. Cucumbers. Brussels sprouts. Grains Whole grains, such as whole-wheat or whole-grain breads, crackers, cereals, and pasta. Unsweetened oatmeal. Bulgur. Barley. Quinoa. Brown rice. Corn or whole-wheat flour tortillas or taco shells. Meats and other proteins Seafood. Poultry without skin. Lean cuts of pork and beef. Tofu. Eggs. Nuts. Beans. Dairy Low-fat or fat-free dairy products, such as yogurt, cottage cheese, and cheese. Beverages Water. Tea. Coffee. Sugar-free or diet soda. Seltzer water. Low-fat or nonfat milk. Milk alternatives, such as soy or almond milk. Fats and oils Olive oil. Canola oil. Sunflower oil. Grapeseed oil. Avocado. Walnuts. Sweets and desserts Sugar-free or low-fat pudding. Sugar-free or low-fat ice  cream and other frozen treats. Seasonings and condiments Herbs. Sodium-free spices. Mustard. Relish. Low-salt, low-sugar ketchup. Low-salt, low-sugar barbecue sauce. Low-fat or fat-free  mayonnaise. The items listed above may not be a complete list of recommended foods and beverages. Contact a dietitian for more information. What foods are not recommended? Fruits Fruits canned with syrup. Vegetables Canned vegetables. Frozen vegetables with butter or cream sauce. Grains Refined white flour and flour products, such as bread, pasta, snack foods, and cereals. Meats and other proteins Fatty cuts of meat. Poultry with skin. Breaded or fried meat. Processed meats. Dairy Full-fat yogurt, cheese, or milk. Beverages Sweetened drinks, such as iced tea and soda. Fats and oils Butter. Lard. Ghee. Sweets and desserts Baked goods, such as cake, cupcakes, pastries, cookies, and cheesecake. Seasonings and condiments Spice mixes with added salt. Ketchup. Barbecue sauce. Mayonnaise. The items listed above may not be a complete list of foods and beverages that are not recommended. Contact a dietitian for more information. Where to find more information  American Diabetes Association: www.diabetes.org Summary  You may need to make diet and lifestyle changes to help prevent the onset of diabetes. These changes can help you control blood sugar, improve cholesterol levels, and manage blood pressure.  Set weight loss goals with help from your health care team. It is recommended that most people with prediabetes lose 7% of their body weight.  Consider following a Mediterranean diet. This includes eating plenty of fresh fruits and vegetables, whole grains, beans, nuts, seeds, fish, and low-fat dairy, and using olive oil instead of other fats. This information is not intended to replace advice given to you by your health care provider. Make sure you discuss any questions you have with your health care provider. Document Revised: 10/06/2019 Document Reviewed: 10/06/2019 Elsevier Patient Education  2021 [employer]   Managing Your Hypertension Hypertension, also called high blood  pressure, is when the force of the blood pressing against the walls of the arteries is too strong. Arteries are blood vessels that carry blood from your heart throughout your body. Hypertension forces the heart to work harder to pump blood and may cause the arteries to become narrow or stiff. Understanding blood pressure readings Your personal target blood pressure may vary depending on your medical conditions, your age, and other factors. A blood pressure reading includes a higher number over a lower number. Ideally, your blood pressure should be below 120/80. You should know that:  The first, or top, number is called the systolic pressure. It is a measure of the pressure in your arteries as your heart beats.  The second, or bottom number, is called the diastolic pressure. It is a measure of the pressure in your arteries as the heart relaxes. Blood pressure is classified into four stages. Based on your blood pressure reading, your health care provider may use the following stages to determine what type of treatment you need, if any. Systolic pressure and diastolic pressure are measured in a unit called mmHg. Normal  Systolic pressure: below 120.  Diastolic pressure: below 80. Elevated  Systolic pressure: 120-129.  Diastolic pressure: below 80. Hypertension stage 1  Systolic pressure: 130-139.  Diastolic pressure: 80-89. Hypertension stage 2  Systolic pressure: 140 or above.  Diastolic pressure: 90 or above. How can this condition affect me? Managing your hypertension is an important responsibility. Over time, hypertension can damage the arteries and decrease blood flow to important parts of the body, including the brain, heart, and kidneys. Having untreated or uncontrolled hypertension  can lead to:  A heart attack.  A stroke.  A weakened blood vessel (aneurysm).  Heart failure.  Kidney damage.  Eye damage.  Metabolic syndrome.  Memory and concentration  problems.  Vascular dementia. What actions can I take to manage this condition? Hypertension can be managed by making lifestyle changes and possibly by taking medicines. Your health care provider will help you make a plan to bring your blood pressure within a normal range. Nutrition  Eat a diet that is high in fiber and potassium, and low in salt (sodium), added sugar, and fat. An example eating plan is called the Dietary Approaches to Stop Hypertension (DASH) diet. To eat this way: ? Eat plenty of fresh fruits and vegetables. Try to fill one-half of your plate at each meal with fruits and vegetables. ? Eat whole grains, such as whole-wheat pasta, brown rice, or whole-grain bread. Fill about one-fourth of your plate with whole grains. ? Eat low-fat dairy products. ? Avoid fatty cuts of meat, processed or cured meats, and poultry with skin. Fill about one-fourth of your plate with lean proteins such as fish, chicken without skin, beans, eggs, and tofu. ? Avoid pre-made and processed foods. These tend to be higher in sodium, added sugar, and fat.  Reduce your daily sodium intake. Most people with hypertension should eat less than 1,500 mg of sodium a day.   Lifestyle  Work with your health care provider to maintain a healthy body weight or to lose weight. Ask what an ideal weight is for you.  Get at least 30 minutes of exercise that causes your heart to beat faster (aerobic exercise) most days of the week. Activities may include walking, swimming, or biking.  Include exercise to strengthen your muscles (resistance exercise), such as weight lifting, as part of your weekly exercise routine. Try to do these types of exercises for 30 minutes at least 3 days a week.  Do not use any products that contain nicotine or tobacco, such as cigarettes, e-cigarettes, and chewing tobacco. If you need help quitting, ask your health care provider.  Control any long-term (chronic) conditions you have, such as  high cholesterol or diabetes.  Identify your sources of stress and find ways to manage stress. This may include meditation, deep breathing, or making time for fun activities.   Alcohol use  Do not drink alcohol if: ? Your health care provider tells you not to drink. ? You are pregnant, may be pregnant, or are planning to become pregnant.  If you drink alcohol: ? Limit how much you use to:  0-1 drink a day for women.  0-2 drinks a day for men. ? Be aware of how much alcohol is in your drink. In the [place] one drink equals one 12 oz bottle of beer (355 mL), one 5 oz glass of wine (148 mL), or one 1 oz glass of hard liquor (44 mL). Medicines Your health care provider may prescribe medicine if lifestyle changes are not enough to get your blood pressure under control and if:  Your systolic blood pressure is 130 or higher.  Your diastolic blood pressure is 80 or higher. Take medicines only as told by your health care provider. Follow the directions carefully. Blood pressure medicines must be taken as told by your health care provider. The medicine does not work as well when you skip doses. Skipping doses also puts you at risk for problems. Monitoring Before you monitor your blood pressure:  Do not smoke, drink caffeinated beverages,  or exercise within 30 minutes before taking a measurement.  Use the bathroom and empty your bladder (urinate).  Sit quietly for at least 5 minutes before taking measurements. Monitor your blood pressure at home as told by your health care provider. To do this:  Sit with your back straight and supported.  Place your feet flat on the floor. Do not cross your legs.  Support your arm on a flat surface, such as a table. Make sure your upper arm is at heart level.  Each time you measure, take two or three readings one minute apart and record the results. You may also need to have your blood pressure checked regularly by your health care provider.   General  information  Talk with your health care provider about your diet, exercise habits, and other lifestyle factors that may be contributing to hypertension.  Review all the medicines you take with your health care provider because there may be side effects or interactions.  Keep all visits as told by your health care provider. Your health care provider can help you create and adjust your plan for managing your high blood pressure. Where to find more information  National Heart, Lung, and Blood Institute: PopSteam.is  American Heart Association: www.heart.org Contact a health care provider if:  You think you are having a reaction to medicines you have taken.  You have repeated (recurrent) headaches.  You feel dizzy.  You have swelling in your ankles.  You have trouble with your vision. Get help right away if:  You develop a severe headache or confusion.  You have unusual weakness or numbness, or you feel faint.  You have severe pain in your chest or abdomen.  You vomit repeatedly.  You have trouble breathing. These symptoms may represent a serious problem that is an emergency. Do not wait to see if the symptoms will go away. Get medical help right away. Call your local emergency services (911 in the [place] Do not drive yourself to the hospital. Summary  Hypertension is when the force of blood pumping through your arteries is too strong. If this condition is not controlled, it may put you at risk for serious complications.  Your personal target blood pressure may vary depending on your medical conditions, your age, and other factors. For most people, a normal blood pressure is less than 120/80.  Hypertension is managed by lifestyle changes, medicines, or both.  Lifestyle changes to help manage hypertension include losing weight, eating a healthy, low-sodium diet, exercising more, stopping smoking, and limiting alcohol. This information is not intended to replace advice  given to you by your health care provider. Make sure you discuss any questions you have with your health care provider. Document Revised: 08/12/2019 Document Reviewed: 06/07/2019 Elsevier Patient Education  2021 [employer].

## 2020-11-29 ENCOUNTER — Other Ambulatory Visit: Payer: Self-pay

## 2020-11-29 ENCOUNTER — Ambulatory Visit: Payer: Self-pay | Admitting: *Deleted

## 2020-11-29 VITALS — BP 136/90 | HR 86

## 2020-11-29 DIAGNOSIS — Z0000 Encounter for general adult medical examination without abnormal findings: Secondary | ICD-10-CM

## 2020-11-29 NOTE — Progress Notes (Signed)
[employer] insurance premium discount evaluation: Labs Drawn. Replacements ROI form signed. Tobacco Free Attestation form signed.  Forms placed in paper chart.  

## 2020-11-30 ENCOUNTER — Encounter: Payer: Self-pay | Admitting: Registered Nurse

## 2020-11-30 LAB — CMP12+LP+TP+TSH+6AC+PSA+CBC…
ALT: 44 IU/L (ref 0–44)
AST: 25 IU/L (ref 0–40)
Albumin/Globulin Ratio: 1.5 (ref 1.2–2.2)
Albumin: 4.6 g/dL (ref 4.0–5.0)
Alkaline Phosphatase: 83 IU/L (ref 44–121)
BUN/Creatinine Ratio: 13 (ref 9–20)
BUN: 11 mg/dL (ref 6–24)
Basophils Absolute: 0.1 10*3/uL (ref 0.0–0.2)
Basos: 2 %
Bilirubin Total: 0.5 mg/dL (ref 0.0–1.2)
Calcium: 9.9 mg/dL (ref 8.7–10.2)
Chloride: 102 mmol/L (ref 96–106)
Chol/HDL Ratio: 5 ratio (ref 0.0–5.0)
Cholesterol, Total: 224 mg/dL — ABNORMAL HIGH (ref 100–199)
Creatinine, Ser: 0.87 mg/dL (ref 0.76–1.27)
EOS (ABSOLUTE): 0.1 10*3/uL (ref 0.0–0.4)
Eos: 2 %
Estimated CHD Risk: 1 times avg. (ref 0.0–1.0)
Free Thyroxine Index: 1.8 (ref 1.2–4.9)
GGT: 65 IU/L (ref 0–65)
Globulin, Total: 3 g/dL (ref 1.5–4.5)
Glucose: 101 mg/dL — ABNORMAL HIGH (ref 65–99)
HDL: 45 mg/dL (ref >39)
Hematocrit: 44.8 % (ref 37.5–51.0)
Hemoglobin: 15.2 g/dL (ref 13.0–17.7)
Immature Grans (Abs): 0 10*3/uL (ref 0.0–0.1)
Immature Granulocytes: 0 %
Iron: 121 ug/dL (ref 38–169)
LDH: 223 IU/L (ref 121–224)
LDL Chol Calc (NIH): 156 mg/dL — ABNORMAL HIGH (ref 0–99)
Lymphocytes Absolute: 2.9 10*3/uL (ref 0.7–3.1)
Lymphs: 38 %
MCH: 29.6 pg (ref 26.6–33.0)
MCHC: 33.9 g/dL (ref 31.5–35.7)
MCV: 87 fL (ref 79–97)
Monocytes Absolute: 0.5 10*3/uL (ref 0.1–0.9)
Monocytes: 6 %
Neutrophils Absolute: 4 10*3/uL (ref 1.4–7.0)
Neutrophils: 52 %
Phosphorus: 3.5 mg/dL (ref 2.8–4.1)
Platelets: 314 10*3/uL (ref 150–450)
Potassium: 4.2 mmol/L (ref 3.5–5.2)
Prostate Specific Ag, Serum: 3.1 ng/mL (ref 0.0–4.0)
RBC: 5.13 x10E6/uL (ref 4.14–5.80)
RDW: 13.2 % (ref 11.6–15.4)
Sodium: 138 mmol/L (ref 134–144)
T3 Uptake Ratio: 23 % — ABNORMAL LOW (ref 24–39)
T4, Total: 7.8 ug/dL (ref 4.5–12.0)
TSH: 1.85 u[IU]/mL (ref 0.450–4.500)
Total Protein: 7.6 g/dL (ref 6.0–8.5)
Triglycerides: 129 mg/dL (ref 0–149)
Uric Acid: 7.5 mg/dL (ref 3.8–8.4)
VLDL Cholesterol Cal: 23 mg/dL (ref 5–40)
WBC: 7.7 10*3/uL (ref 3.4–10.8)
eGFR: 108 mL/min/{1.73_m2} (ref >59)

## 2020-11-30 LAB — HGB A1C W/O EAG: Hgb A1c MFr Bld: 6.5 % — ABNORMAL HIGH (ref 4.8–5.6)

## 2020-12-05 NOTE — Telephone Encounter (Signed)
Spouse reported patient was able to pick up medicine/all symptoms resolved and covid test taken today negative prior to international travel.  Patient will be in [place] for 6 weeks.  Patient had no further questions or concerns at this time.

## 2020-12-05 NOTE — Telephone Encounter (Signed)
Left message for patient asking if all symptoms resolved and if he was able to pick up Rx that I filled from [facility] a week ago as I was on vacation last Thursday and working remote on Tuesday 17 May.

## 2021-01-23 ENCOUNTER — Encounter: Payer: Self-pay | Admitting: Cardiology

## 2021-01-23 ENCOUNTER — Ambulatory Visit: Payer: No Typology Code available for payment source | Admitting: Cardiology

## 2021-01-23 ENCOUNTER — Other Ambulatory Visit: Payer: Self-pay

## 2021-01-23 VITALS — BP 145/96 | HR 82 | Temp 98.2°F | Resp 16 | Ht 67.0 in | Wt 237.0 lb

## 2021-01-23 DIAGNOSIS — R7303 Prediabetes: Secondary | ICD-10-CM | POA: Insufficient documentation

## 2021-01-23 DIAGNOSIS — I10 Essential (primary) hypertension: Secondary | ICD-10-CM

## 2021-01-23 DIAGNOSIS — E7800 Pure hypercholesterolemia, unspecified: Secondary | ICD-10-CM

## 2021-01-23 DIAGNOSIS — E1165 Type 2 diabetes mellitus with hyperglycemia: Secondary | ICD-10-CM

## 2021-01-23 DIAGNOSIS — E6609 Other obesity due to excess calories: Secondary | ICD-10-CM

## 2021-01-23 MED ORDER — LOSARTAN POTASSIUM 25 MG PO TABS
25.0000 mg | ORAL_TABLET | Freq: Every morning | ORAL | 0 refills | Status: DC
Start: 2021-01-23 — End: 2021-01-24

## 2021-01-23 NOTE — Progress Notes (Signed)
Adrian Alvarez Date of Birth: 1976/05/18 MRN: [id number] Primary Care Provider:Greene, Asencion Partridge, MD Primary Cardiologist: Tessa Lerner, DO, [facility] (established care 11/24/2019)  Date: 01/23/21 Last Office Visit: 01/24/2020  Chief Complaint  Patient presents with   Follow-up    1 year follow up    Hypertension    HPI  Adrian Alvarez is a 45 y.o. male who presents to the office with a  chief complaint of " 1 year follow-up visit for reevaluation of chest pain and high blood pressure."  His past medical history and cardiovascular risk factors are: Hypertension, hyperlipidemia, diabetes, obesity.   Referred to the office for evaluation of chest pain and hypertension by his primary care provider in the past.  He is accompanied by his daughter who helps provide history of present illness and also is acting as an Equities trader.  Patient provides verbal consent with regards to having her present during today's office visit.  Patient is here for 1 year follow-up visit.  No recurrence of chest pain.  In the past he has undergone ischemic evaluation including an exercise nuclear stress test and echocardiogram.  Results noted below for further reference.  With regards to benign essential hypertension patient's daughter states that his blood pressures at home have been elevated with a range between 130-140 mmHg.  Medications reconciled.  He recently is traveling back from [place] after a 4-week trip and was indiscretion to salt and alcohol intake.  Most recent labs from May 2022 reviewed patient's hemoglobin A1c was 6.5 consistent with type 2 diabetes.  Patient is also gained approximately 8 pounds since last office visit.  No change in his overall functional status.  He still tries to walk 30 to 45 minutes/day 4 days a week with his daughter.  Patient has been aware of being evaluated for sleep apnea but this is still pending.  FUNCTIONAL STATUS: He tries to walk 30 -45 mins 4x per week.    ALLERGIES: No Known Allergies  MEDICATION LIST PRIOR TO VISIT: Current Meds  Medication Sig   amLODipine (NORVASC) 10 MG tablet Take 1 tablet (10 mg total) by mouth daily.   hydrochlorothiazide (HYDRODIURIL) 12.5 MG tablet Take 1 tablet (12.5 mg total) by mouth daily.   losartan (COZAAR) 25 MG tablet Take 1 tablet (25 mg total) by mouth every morning.   Multiple Vitamin (MULTIVITAMIN) tablet Take 1 tablet by mouth daily.     PAST MEDICAL HISTORY: Past Medical History:  Diagnosis Date   Diabetes (HCC)    Hyperlipidemia    Hypertension    Pre-diabetes    Tuberculosis     PAST SURGICAL HISTORY: History reviewed. No pertinent surgical history.  FAMILY HISTORY: The patient family history includes Healthy in his brother, brother, sister, sister, sister, sister, and sister; Hypertension in his father; Thyroid disease in his mother and sister.  SOCIAL HISTORY:  The patient  reports that he has never smoked. He has never used smokeless tobacco. He reports current alcohol use of about 6.0 standard drinks of alcohol per week. He reports that he does not use drugs.  REVIEW OF SYSTEMS: Review of Systems  Constitutional: Negative for chills and fever.  HENT:  Negative for hoarse voice and nosebleeds.   Eyes:  Negative for discharge, double vision and pain.  Cardiovascular:  Negative for chest pain, claudication, dyspnea on exertion, leg swelling, near-syncope, orthopnea, palpitations, paroxysmal nocturnal dyspnea and syncope.  Respiratory:  Negative for hemoptysis and shortness of breath.   Musculoskeletal:  Negative for muscle cramps  and myalgias.  Gastrointestinal:  Negative for abdominal pain, constipation, diarrhea, hematemesis, hematochezia, melena, nausea and vomiting.  Neurological:  Negative for dizziness and light-headedness.   PHYSICAL EXAM: Vitals with BMI 01/23/2021 01/23/2021 11/29/2020  Height - 5\' 7"  -  Weight - 237 lbs -  BMI - 37.11 -  Systolic 145 157 [id number]  Diastolic  96 95 90  Pulse 82 76 86    CONSTITUTIONAL: Well-developed and well-nourished. No acute distress.  SKIN: Skin is warm and dry. No rash noted. No cyanosis. No pallor. No jaundice HEAD: Normocephalic and atraumatic.  EYES: No scleral icterus MOUTH/THROAT: Moist oral membranes.  NECK: No JVD present. No thyromegaly noted. No carotid bruits  LYMPHATIC: No visible cervical adenopathy.  CHEST Normal respiratory effort. No intercostal retractions  LUNGS: Clear to auscultation bilaterally.  No stridor. No wheezes. No rales.  CARDIOVASCULAR: Regular rate and rhythm, positive S1-S2, no murmurs rubs or gallops appreciated ABDOMINAL: Obese, soft, nontender, nondistended, positive bowel sounds all 4 quadrants. No apparent ascites.  EXTREMITIES: No peripheral edema  HEMATOLOGIC: No significant bruising NEUROLOGIC: Oriented to person, place, and time. Nonfocal. Normal muscle tone.  PSYCHIATRIC: Normal mood and affect. Normal behavior. Cooperative  CARDIAC DATABASE: EKG: 01/23/2021: Normal sinus rhythm, 78 bpm, normal axis, without underlying ischemia or injury pattern.  Echocardiogram: 01/16/2020: LVEF 65%, normal diastolic filling pattern, mild TR, no pulmonary hypertension.  Stress Testing: Exercise Sestamibi Stress Test 01/16/2020:  Normal ECG stress. The patient exercised for 9 minutes and 59 seconds of a Bruce protocol, achieving approximately 11.78 METs.  Normal BP response.  Stress terminated due to THR and fatigue achieved.  Myocardial perfusion is normal.  Overall LV systolic function is normal without regional wall motion abnormalities.  Stress LV EF: 65%.  No previous exam available for comparison. Low risk.   Heart Catheterization: None  LABORATORY DATA: CBC Latest Ref Rng & Units 11/29/2020 05/24/2019 11/05/2018  WBC 3.4 - 10.8 x10E3/uL 7.7 9.4 9.7  Hemoglobin 13.0 - 17.7 g/dL 09.615.2 04.515.7 40.915.4  Hematocrit 37.5 - 51.0 % 44.8 46.6 45.1  Platelets 150 - 450 x10E3/uL 314 301 300     CMP Latest Ref Rng & Units 11/29/2020 08/16/2020 05/16/2020  Glucose 65 - 99 mg/dL 811(B101(H) 87 95  BUN 6 - 24 mg/dL 11 12 9   Creatinine 0.76 - 1.27 mg/dL 1.470.87 8.290.88 5.620.82  Sodium 134 - 144 mmol/L 138 139 139  Potassium 3.5 - 5.2 mmol/L 4.2 4.2 4.5  Chloride 96 - 106 mmol/L 102 103 102  CO2 20 - 29 mmol/L - 22 24  Calcium 8.7 - 10.2 mg/dL 9.9 9.9 13.010.1  Total Protein 6.0 - 8.5 g/dL 7.6 - 7.8  Total Bilirubin 0.0 - 1.2 mg/dL 0.5 - 0.5  Alkaline Phos 44 - 121 IU/L 83 - 83  AST 0 - 40 IU/L 25 - 24  ALT 0 - 44 IU/L 44 - 38    Lipid Panel  Lab Results  Component Value Date   CHOL 224 (H) 11/29/2020   HDL 45 11/29/2020   LDLCALC 156 (H) 11/29/2020   TRIG 129 11/29/2020   CHOLHDL 5.0 11/29/2020   No components found for: NTPROBNP Lab Results  Component Value Date   TSH 1.850 11/29/2020   TSH 1.290 05/24/2019   TSH 1.830 11/05/2018    BMP Recent Labs    05/16/20 0959 08/16/20 1003 11/29/20 0905  NA 139 139 138  K 4.5 4.2 4.2  CL 102 103 102  CO2 24 22  --   GLUCOSE  95 87 101*  BUN 9 12 11   CREATININE 0.82 0.88 0.87  CALCIUM 10.1 9.9 9.9  GFRNONAA 106 103  --   GFRAA 123 119  --    HEMOGLOBIN A1C Lab Results  Component Value Date   HGBA1C 6.5 (H) 11/29/2020   MPG 120 (H) 03/21/2015    IMPRESSION:    ICD-10-CM   1. Benign hypertension  I10 EKG 12-Lead    Basic metabolic panel    Magnesium    2. Pure hypercholesterolemia  E78.00     3. Type 2 diabetes mellitus with hyperglycemia, without long-term current use of insulin (HCC)  E11.65     4. Class 2 obesity due to excess calories without serious comorbidity with body mass index (BMI) of 37.0 to 37.9 in adult  E66.09    Z68.37        RECOMMENDATIONS: Aniket Paye is a 45 y.o. male whose past medical history and cardiac risk factors include: Hypertension, hyperlipidemia, prediabetes, obesity.  Patient is originally referred to me for evaluation of chest pain and benign essential hypertension.  He now  presents for a 1 year office visit.  Over the last 1 year he has been asymptomatic with regards to angina pectoris or ACS symptoms.  Ischemic evaluation performed earlier as noted above and reviewed as part of this visit.  Benign essential hypertension: Home blood pressures are ranging between 130-140 mmHg as per his daughter. He takes amlodipine and hydrochlorothiazide both in the morning. In the setting of diabetes will add losartan 25 mg in the morning.  Recommend taking amlodipine at night.  Check Bloodwork in 1 week to evaluate kidney function and electrolytes.  Hyperlipidemia: Most recent labs from May 2022 reviewed.  Most recent LDL 156 mg/dL. Recommend initiation of statin therapy in the setting of new onset of diabetes with a hemoglobin A1c of 6.5.   Patient would like to discuss this further with his PCP. Educated on the importance of decreasing cholesterol rich foods in his daily diet and decreasing alcohol consumption's to help improve triglyceride levels.  Obesity, due to excess calories: Body mass index is 37.12 kg/m.  Unfortunately, he has gained approximately 8 pounds in the last office visit.   But continues to exercise at least 30 to 45 minutes/day I reviewed with the patient the importance of diet, regular physical activity/exercise, weight loss.   Patient is educated on increasing physical activity gradually as tolerated.  With the goal of moderate intensity exercise for 30 minutes a day 5 days a week.  Is a part of this office encounter reviewed most recent labs from May 2022 independently and also discussed disease management with the patient and his daughter at today's encounter.  I would like to see the patient back in 3 months to reevaluate the management of benign essential hypertension.  In the meantime patient's daughter is asked to call the office if his systolic blood pressures are consistently greater than 130 mmHg for further medication titration.  FINAL  MEDICATION LIST END OF ENCOUNTER: Meds ordered this encounter  Medications   losartan (COZAAR) 25 MG tablet    Sig: Take 1 tablet (25 mg total) by mouth every morning.    Dispense:  90 tablet    Refill:  0     Current Outpatient Medications:    amLODipine (NORVASC) 10 MG tablet, Take 1 tablet (10 mg total) by mouth daily., Disp: 90 tablet, Rfl: 1   hydrochlorothiazide (HYDRODIURIL) 12.5 MG tablet, Take 1 tablet (12.5 mg total)  by mouth daily., Disp: 90 tablet, Rfl: 1   losartan (COZAAR) 25 MG tablet, Take 1 tablet (25 mg total) by mouth every morning., Disp: 90 tablet, Rfl: 0   Multiple Vitamin (MULTIVITAMIN) tablet, Take 1 tablet by mouth daily., Disp: , Rfl:   Orders Placed This Encounter  Procedures   Basic metabolic panel   Magnesium   EKG 12-Lead    --Continue cardiac medications as reconciled in final medication list. --Return in about 3 months (around 04/25/2021) for Follow up, BP. Or sooner if needed. --Continue follow-up with your primary care physician regarding the management of your other chronic comorbid conditions.  Patient's questions and concerns were addressed to his satisfaction. He voices understanding of the instructions provided during this encounter.   This note was created using a voice recognition software as a result there may be grammatical errors inadvertently enclosed that do not reflect the nature of this encounter. Every attempt is made to correct such errors.  Tessa Lerner, [place], [facility]  Pager: [phone] Office: [phone]

## 2021-01-24 ENCOUNTER — Telehealth: Payer: Self-pay | Admitting: *Deleted

## 2021-01-24 MED ORDER — LOSARTAN POTASSIUM 25 MG PO TABS: 25.0000 mg | ORAL_TABLET | Freq: Every morning | ORAL | 0 refills | Status: DC

## 2021-01-24 NOTE — Telephone Encounter (Signed)
Pt had new Losartan Rx sent to [facility] however we do not carry this Rx. Pt notified of same and preferred external pharmacy confirmed. Will resend.

## 2021-01-28 ENCOUNTER — Other Ambulatory Visit: Payer: Self-pay

## 2021-01-28 DIAGNOSIS — I10 Essential (primary) hypertension: Secondary | ICD-10-CM

## 2021-02-05 LAB — BASIC METABOLIC PANEL
BUN/Creatinine Ratio: 12 (ref 9–20)
BUN: 12 mg/dL (ref 6–24)
CO2: 26 mmol/L (ref 20–29)
Calcium: 9.7 mg/dL (ref 8.7–10.2)
Chloride: 101 mmol/L (ref 96–106)
Creatinine, Ser: 0.99 mg/dL (ref 0.76–1.27)
Glucose: 112 mg/dL — ABNORMAL HIGH (ref 65–99)
Potassium: 4.5 mmol/L (ref 3.5–5.2)
Sodium: 141 mmol/L (ref 134–144)
eGFR: 95 mL/min/{1.73_m2} (ref >59)

## 2021-02-05 LAB — MAGNESIUM: Magnesium: 2.4 mg/dL — ABNORMAL HIGH (ref 1.6–2.3)

## 2021-02-08 NOTE — Progress Notes (Signed)
Called and spoke with patient regarding his most recent lab results.

## 2021-02-08 NOTE — Progress Notes (Signed)
Called patient, NA, LMAM

## 2021-04-26 ENCOUNTER — Other Ambulatory Visit: Payer: Self-pay

## 2021-04-26 ENCOUNTER — Ambulatory Visit: Payer: No Typology Code available for payment source | Admitting: Cardiology

## 2021-04-26 ENCOUNTER — Encounter: Payer: Self-pay | Admitting: Cardiology

## 2021-04-26 VITALS — BP 116/78 | HR 77 | Temp 98.1°F | Resp 17 | Ht 67.0 in | Wt 242.6 lb

## 2021-04-26 DIAGNOSIS — E7800 Pure hypercholesterolemia, unspecified: Secondary | ICD-10-CM

## 2021-04-26 DIAGNOSIS — E1165 Type 2 diabetes mellitus with hyperglycemia: Secondary | ICD-10-CM

## 2021-04-26 DIAGNOSIS — E6609 Other obesity due to excess calories: Secondary | ICD-10-CM

## 2021-04-26 DIAGNOSIS — Z6838 Body mass index (BMI) 38.0-38.9, adult: Secondary | ICD-10-CM

## 2021-04-26 DIAGNOSIS — I10 Essential (primary) hypertension: Secondary | ICD-10-CM

## 2021-04-26 MED ORDER — LOSARTAN POTASSIUM 25 MG PO TABS: 25.0000 mg | ORAL_TABLET | Freq: Every morning | ORAL | 1 refills | Status: DC

## 2021-04-26 NOTE — Progress Notes (Signed)
Adrian Alvarez Date of Birth: [birth year] MRN: [id number] Primary Care Provider:Greene, Asencion Partridge, MD Primary Cardiologist: Tessa Lerner, DO, [facility] (established care 11/24/2019)  Date: 04/26/21 Last Office Visit: 01/23/2021  Chief Complaint  Patient presents with   Follow-up   bp    3 month    HPI  Adrian Alvarez is a 45 y.o. male who presents to the office with a  chief complaint of " 89-month follow-up for blood pressure management."  His past medical history and cardiovascular risk factors are: Hypertension, hyperlipidemia, diabetes, obesity.   Patient was initially referred to the office for evaluation of chest pain and hypertension by his primary care provider.  He was recently seen on 1 year follow-up in July 2022 however at that time his blood pressure was elevated at home and patient requested my assistance with regards to titrating his medication.  At the same time he had labs in May 2022 which noted hemoglobin A1c of 6.5 consistent with type 2 diabetes and LDL was 156 mg/dL.  I recommended him to be on statin therapy in the setting of diabetes to improve his LDL to less than 70 mg/dL.  However, patient and his daughter requested that they would like to speak to his PCP prior to making this transition.  This is still pending.  Given his weight, high blood pressure, sleeping habits, recommended him to be evaluated for sleep apnea as well.  This is also pending.  Patient was started on losartan in the setting of diabetes at the last office visit and he is tolerated the medication well without any side effects or intolerances.  Repeat blood work in July 2022 notes preserved kidney function and potassium levels.  His home blood pressures have improved significantly and still have the office numbers.  He denies any chest pain to suggest angina pectoris.  He is overall euvolemic and not in congestive heart failure.  No hospitalizations or urgent care visits for cardiovascular symptoms  since last encounter.  FUNCTIONAL STATUS: He tries to walk 30 - 45 mins 4x per week.   ALLERGIES: No Known Allergies  MEDICATION LIST PRIOR TO VISIT: Current Meds  Medication Sig   amLODipine (NORVASC) 10 MG tablet Take 1 tablet (10 mg total) by mouth daily.   hydrochlorothiazide (HYDRODIURIL) 12.5 MG tablet Take 1 tablet (12.5 mg total) by mouth daily.   Multiple Vitamin (MULTIVITAMIN) tablet Take 1 tablet by mouth daily.   [DISCONTINUED] losartan (COZAAR) 25 MG tablet Take 1 tablet (25 mg total) by mouth every morning.     PAST MEDICAL HISTORY: Past Medical History:  Diagnosis Date   Diabetes (HCC)    Hyperlipidemia    Hypertension    Pre-diabetes    Tuberculosis     PAST SURGICAL HISTORY: Past Surgical History:  Procedure Laterality Date   THORACENTESIS  2000    FAMILY HISTORY: The patient family history includes Healthy in his brother, brother, sister, sister, sister, sister, and sister; Hypertension in his father; Thyroid disease in his mother and sister.  SOCIAL HISTORY:  The patient  reports that he has never smoked. He has never used smokeless tobacco. He reports current alcohol use of about 6.0 standard drinks per week. He reports that he does not use drugs.  REVIEW OF SYSTEMS: Review of Systems  Constitutional: Negative for chills and fever.  HENT:  Negative for hoarse voice and nosebleeds.   Eyes:  Negative for discharge, double vision and pain.  Cardiovascular:  Negative for chest pain, claudication, dyspnea on  exertion, leg swelling, near-syncope, orthopnea, palpitations, paroxysmal nocturnal dyspnea and syncope.  Respiratory:  Negative for hemoptysis and shortness of breath.   Musculoskeletal:  Negative for muscle cramps and myalgias.  Gastrointestinal:  Negative for abdominal pain, constipation, diarrhea, hematemesis, hematochezia, melena and nausea.  Neurological:  Negative for dizziness and light-headedness.   PHYSICAL EXAM: Vitals with BMI  04/26/2021 01/23/2021 01/23/2021  Height 5\' 7"  - 5\' 7"   Weight 242 lbs 10 oz - 237 lbs  BMI 37.99 - 37.11  Systolic 116 145  Diastolic 78 96 95  Pulse 77 82 76    CONSTITUTIONAL: Well-developed and well-nourished. No acute distress.  SKIN: Skin is warm and dry. No rash noted. No cyanosis. No pallor. No jaundice HEAD: Normocephalic and atraumatic.  EYES: No scleral icterus MOUTH/THROAT: Moist oral membranes.  NECK: No JVD present. No thyromegaly noted. No carotid bruits  LYMPHATIC: No visible cervical adenopathy.  CHEST Normal respiratory effort. No intercostal retractions  LUNGS: Clear to auscultation bilaterally.  No stridor. No wheezes. No rales.  CARDIOVASCULAR: Regular rate and rhythm, positive S1-S2, no murmurs rubs or gallops appreciated ABDOMINAL: Obese, soft, nontender, nondistended, positive bowel sounds all 4 quadrants. No apparent ascites.  EXTREMITIES: No peripheral edema  HEMATOLOGIC: No significant bruising NEUROLOGIC: Oriented to person, place, and time. Nonfocal. Normal muscle tone.  PSYCHIATRIC: Normal mood and affect. Normal behavior. Cooperative  CARDIAC DATABASE: EKG: 01/23/2021: Normal sinus rhythm, 78 bpm, normal axis, without underlying ischemia or injury pattern.  Echocardiogram: 01/16/2020: LVEF 65%, normal diastolic filling pattern, mild TR, no pulmonary hypertension.  Stress Testing: Exercise Sestamibi Stress Test 01/16/2020:  Normal ECG stress. The patient exercised for 9 minutes and 59 seconds of a Bruce protocol, achieving approximately 11.78 METs.  Normal BP response.  Stress terminated due to THR and fatigue achieved.  Myocardial perfusion is normal.  Overall LV systolic function is normal without regional wall motion abnormalities.  Stress LV EF: 65%.  No previous exam available for comparison. Low risk.   Heart Catheterization: None  LABORATORY DATA: CBC Latest Ref Rng & Units 11/29/2020 05/24/2019 11/05/2018  WBC 3.4 - 10.8 x10E3/uL 7.7 9.4 9.7   Hemoglobin 13.0 - 17.7 g/dL 13/09/2018 11/07/2018 67.8  Hematocrit 37.5 - 51.0 % 44.8 46.6 45.1  Platelets 150 - 450 x10E3/uL 314 301 300    CMP Latest Ref Rng & Units 02/04/2021 11/29/2020 08/16/2020  Glucose 65 - 99 mg/dL 01/29/2021) 08/18/2020) 87  BUN 6 - 24 mg/dL 12 11 12   Creatinine 0.76 - 1.27 mg/dL 751(W 258(N  Sodium 134 - 144 mmol/L 141 138 139  Potassium 3.5 - 5.2 mmol/L 4.5 4.2 4.2  Chloride 96 - 106 mmol/L 101 102 103  CO2 20 - 29 mmol/L 26 - 22  Calcium 8.7 - 10.2 mg/dL 9.7 9.9 9.9  Total Protein 6.0 - 8.5 g/dL - 7.6 -  Total Bilirubin 0.0 - 1.2 mg/dL - 0.5 -  Alkaline Phos 44 - 121 IU/L - 83 -  AST 0 - 40 IU/L - 25 -  ALT 0 - 44 IU/L - 44 -    Lipid Panel  Lab Results  Component Value Date   CHOL 224 (H) 11/29/2020   HDL 45 11/29/2020   LDLCALC 156 (H) 11/29/2020   TRIG 129 11/29/2020   CHOLHDL 5.0 11/29/2020   No components found for: NTPROBNP Lab Results  Component Value Date   TSH 1.850 11/29/2020   TSH 1.290 05/24/2019   TSH 1.830 11/05/2018    BMP Recent Labs  05/16/20 0959 08/16/20 1003 11/29/20 0905 02/04/21 1408  NA 139 139 138 141  K 4.5 4.2 4.2 4.5  CL 102 103 102 101  CO2 24 22  --  26  GLUCOSE 95 87 101* 112*  BUN 9 12 11 12   CREATININE 0.82 0.88 0.87 0.99  CALCIUM 10.1 9.9 9.9 9.7  GFRNONAA 106 103  --   --   GFRAA 123 119  --   --    HEMOGLOBIN A1C Lab Results  Component Value Date   HGBA1C 6.5 (H) 11/29/2020   MPG 120 (H) 03/21/2015    IMPRESSION:    ICD-10-CM   1. Benign hypertension  I10 losartan (COZAAR) 25 MG tablet    2. Pure hypercholesterolemia  E78.00     3. Type 2 diabetes mellitus with hyperglycemia, without long-term current use of insulin (HCC)  E11.65     4. Class 2 obesity due to excess calories without serious comorbidity with body mass index (BMI) of 38.0 to 38.9 in adult  E66.09    Z68.38        RECOMMENDATIONS: Adrian Alvarez is a 45 y.o. male whose past medical history and cardiac risk factors include:  Hypertension, hyperlipidemia, prediabetes, obesity.  Benign hypertension Home and office blood pressures are well controlled. Medications reconciled. Patient is making a conscious effort with regards to reducing salt intake on a daily basis. Continue to monitor. Refilled losartan.  Pure hypercholesterolemia Most recent LDL 156 mg/dL. In the setting of diabetes recommended statin therapy during the last office visit. Patient states that he has a follow-up visit with PCP in 1 month and will discuss both diabetes and cholesterol management.  For now we will defer to primary team as per patient's wishes.  Type 2 diabetes mellitus with hyperglycemia, without long-term current use of insulin (HCC) Last hemoglobin A1c for reference was 6.5. Patient plans to discuss management at his upcoming office visit with his PCP next month  Class 2 obesity due to excess calories without serious comorbidity with body mass index (BMI) of 38.0 to 38.9 in adult Has gained an additional 5 pounds since last visit. Body mass index is 38 kg/m. I reviewed with the patient the importance of diet, regular physical activity/exercise, weight loss.   Patient is educated on increasing physical activity gradually as tolerated.  With the goal of moderate intensity exercise for 30 minutes a day 5 days a week.  Patient is educated on improving his modifiable cardiovascular risk factors with regards to increasing physical activity to 30 minutes a day 5 days a week, weight loss, glycemic control, and cholesterol management.  I will see him back in 1 year or sooner if needed.  Patient's daughter was kind enough to interpret for the patient during this office visit.  FINAL MEDICATION LIST END OF ENCOUNTER: Meds ordered this encounter  Medications   losartan (COZAAR) 25 MG tablet    Sig: Take 1 tablet (25 mg total) by mouth every morning.    Dispense:  90 tablet    Refill:  1      Current Outpatient Medications:     amLODipine (NORVASC) 10 MG tablet, Take 1 tablet (10 mg total) by mouth daily., Disp: 90 tablet, Rfl: 1   hydrochlorothiazide (HYDRODIURIL) 12.5 MG tablet, Take 1 tablet (12.5 mg total) by mouth daily., Disp: 90 tablet, Rfl: 1   Multiple Vitamin (MULTIVITAMIN) tablet, Take 1 tablet by mouth daily., Disp: , Rfl:    losartan (COZAAR) 25 MG tablet, Take 1 tablet (  25 mg total) by mouth every morning., Disp: 90 tablet, Rfl: 1  No orders of the defined types were placed in this encounter.   --Continue cardiac medications as reconciled in final medication list. --Return in about 1 year (around 04/26/2022) for Follow up. Or sooner if needed. --Continue follow-up with your primary care physician regarding the management of your other chronic comorbid conditions.  Patient's questions and concerns were addressed to his satisfaction. He voices understanding of the instructions provided during this encounter.   This note was created using a voice recognition software as a result there may be grammatical errors inadvertently enclosed that do not reflect the nature of this encounter. Every attempt is made to correct such errors.  Tessa Lerner, [place], [facility]  Pager: [phone] Office: [phone]

## 2021-05-27 ENCOUNTER — Ambulatory Visit: Payer: No Typology Code available for payment source | Admitting: Family Medicine

## 2021-05-27 VITALS — BP 118/72 | HR 72 | Temp 98.0°F | Resp 15 | Ht 67.0 in | Wt 244.0 lb

## 2021-05-27 DIAGNOSIS — Z6838 Body mass index (BMI) 38.0-38.9, adult: Secondary | ICD-10-CM

## 2021-05-27 DIAGNOSIS — R7303 Prediabetes: Secondary | ICD-10-CM

## 2021-05-27 DIAGNOSIS — I10 Essential (primary) hypertension: Secondary | ICD-10-CM

## 2021-05-27 DIAGNOSIS — E785 Hyperlipidemia, unspecified: Secondary | ICD-10-CM

## 2021-05-27 LAB — COMPREHENSIVE METABOLIC PANEL
ALT: 48 U/L (ref 0–53)
AST: 26 U/L (ref 0–37)
Albumin: 4.7 g/dL (ref 3.5–5.2)
Alkaline Phosphatase: 66 U/L (ref 39–117)
BUN: 16 mg/dL (ref 6–23)
CO2: 25 mEq/L (ref 19–32)
Calcium: 9.7 mg/dL (ref 8.4–10.5)
Chloride: 104 mEq/L (ref 96–112)
Creatinine, Ser: 0.79 mg/dL (ref 0.40–1.50)
GFR: 106.09 mL/min (ref >60.00)
Glucose, Bld: 101 mg/dL — ABNORMAL HIGH (ref 70–99)
Potassium: 4.3 mEq/L (ref 3.5–5.1)
Sodium: 138 mEq/L (ref 135–145)
Total Bilirubin: 0.6 mg/dL (ref 0.2–1.2)
Total Protein: 7.5 g/dL (ref 6.0–8.3)

## 2021-05-27 LAB — LIPID PANEL
Cholesterol: 200 mg/dL (ref 0–200)
HDL: 44.8 mg/dL (ref >39.00)
LDL Cholesterol: 132 mg/dL — ABNORMAL HIGH (ref 0–99)
NonHDL: 155.22
Total CHOL/HDL Ratio: 4
Triglycerides: 116 mg/dL (ref 0.0–149.0)
VLDL: 23.2 mg/dL (ref 0.0–40.0)

## 2021-05-27 LAB — HEMOGLOBIN A1C: Hgb A1c MFr Bld: 6.6 % — ABNORMAL HIGH (ref 4.6–6.5)

## 2021-05-27 MED ORDER — HYDROCHLOROTHIAZIDE 12.5 MG PO TABS: 12.5000 mg | ORAL_TABLET | Freq: Every day | ORAL | 1 refills | Status: DC

## 2021-05-27 MED ORDER — AMLODIPINE BESYLATE 10 MG PO TABS: 10.0000 mg | ORAL_TABLET | Freq: Every day | ORAL | 1 refills | Status: DC

## 2021-05-27 NOTE — Progress Notes (Signed)
Subjective:  Patient ID: Adrian Alvarez, male    DOB: 10-05-1975  Age: 45 y.o. MRN: [id number]  CC:  Chief Complaint  Patient presents with   Prediabetes    Pt reports working on diet and trying to exercise, recent blood work reports still high according to daughter    Hypertension    Pt due for a recheck, denies physical sxs,    Immunizations    Pt would like to get pneumonia vaccine today     HPI Adrian Alvarez presents for  Prediabetes, obesity.  Due for updated A1c, elevated near diabetic range in May, plan for diet/exercise approach.  Weight has gone up somewhat since that time.  Weight 237 on 11/26/20.  Improved exercise - increased walking during breaks and walking at home. Some difficulty with diet. Ice cream, desserts, late meals. Cut back on alcohol - none in typical week - few at party.  Lab Results  Component Value Date   HGBA1C 6.5 (H) 11/29/2020   Wt Readings from Last 3 Encounters:  05/27/21 244 lb (110.7 kg)  04/26/21 242 lb 9.6 oz (110 kg)  01/23/21 237 lb (107.5 kg)    Hypertension: Amlodipine 10 mg daily, hydrochlorothiazide 12.5 mg daily, losartan 25 mg daily. No new side effects. Some urinary frequency after meds, no changes.  Home readings: BP Readings from Last 3 Encounters:  05/27/21 118/72  04/26/21 116/78  01/23/21 (!) 145/96   Lab Results  Component Value Date   CREATININE 0.99 02/04/2021    Hyperlipidemia: No current statin. Exercise as above.  Lab Results  Component Value Date   CHOL 224 (H) 11/29/2020   HDL 45 11/29/2020   LDLCALC 156 (H) 11/29/2020   TRIG 129 11/29/2020   CHOLHDL 5.0 11/29/2020   Lab Results  Component Value Date   ALT 44 11/29/2020   AST 25 11/29/2020   GGT 65 11/29/2020   ALKPHOS 83 11/29/2020   BILITOT 0.5 11/29/2020     History Patient Active Problem List   Diagnosis Date Noted   Pre-diabetes 01/23/2021   Past Medical History:  Diagnosis Date   Diabetes (HCC)    Hyperlipidemia    Hypertension     Pre-diabetes    Tuberculosis    Past Surgical History:  Procedure Laterality Date   THORACENTESIS  2000   No Known Allergies Prior to Admission medications   Medication Sig Start Date End Date Taking? Authorizing Provider  amLODipine (NORVASC) 10 MG tablet Take 1 tablet (10 mg total) by mouth daily. 11/26/20  Yes Shade Flood, MD  hydrochlorothiazide (HYDRODIURIL) 12.5 MG tablet Take 1 tablet (12.5 mg total) by mouth daily. 11/26/20  Yes Shade Flood, MD  losartan (COZAAR) 25 MG tablet Take 1 tablet (25 mg total) by mouth every morning. 04/26/21 10/23/21 Yes Tolia, Sunit, DO  Multiple Vitamin (MULTIVITAMIN) tablet Take 1 tablet by mouth daily.   Yes [provider]   Social History   Socioeconomic History   Marital status: Married    Spouse name: Not on file   Number of children: 1   Years of education: Not on file   Highest education level: Not on file  Occupational History   Not on file  Tobacco Use   Smoking status: Never   Smokeless tobacco: Never  Vaping Use   Vaping Use: Never used  Substance and Sexual Activity   Alcohol use: Yes    Alcohol/week: 6.0 standard drinks    Types: 6 Standard drinks or equivalent per week  Comment: occ   Drug use: No   Sexual activity: Not on file  Other Topics Concern   Not on file  Social History Narrative   Not on file   Social Determinants of Health   Financial Resource Strain: Not on file  Food Insecurity: Not on file  Transportation Needs: Not on file  Physical Activity: Not on file  Stress: Not on file  Social Connections: Not on file  Intimate Partner Violence: Not on file    Review of Systems  Constitutional:  Negative for fatigue and unexpected weight change.  Eyes:  Negative for visual disturbance.  Respiratory:  Negative for cough, chest tightness and shortness of breath.   Cardiovascular:  Negative for chest pain, palpitations and leg swelling.  Gastrointestinal:  Negative for abdominal pain and  blood in stool.  Neurological:  Negative for dizziness, light-headedness and headaches.    Objective:   Vitals:   05/27/21 0831  BP: 118/72  Pulse: 72  Resp: 15  Temp: 98 F (36.7 C)  TempSrc: Temporal  SpO2: 98%  Weight: 244 lb (110.7 kg)  Height: 5\' 7"  (1.702 m)     Physical Exam Vitals reviewed.  Constitutional:      Appearance: He is well-developed. He is obese.  HENT:     Head: Normocephalic and atraumatic.  Neck:     Vascular: No carotid bruit or JVD.  Cardiovascular:     Rate and Rhythm: Normal rate and regular rhythm.     Heart sounds: Normal heart sounds. No murmur heard. Pulmonary:     Effort: Pulmonary effort is normal.     Breath sounds: Normal breath sounds. No rales.  Musculoskeletal:     Right lower leg: No edema.     Left lower leg: No edema.  Skin:    General: Skin is warm and dry.  Neurological:     Mental Status: He is alert and oriented to person, place, and time.  Psychiatric:        Mood and Affect: Mood normal.       Assessment & Plan:  Adrian Alvarez is a 45 y.o. male . Prediabetes - Plan: Comprehensive metabolic panel, Hemoglobin A1c  A1c at level of possible diabetes in May, stressed importance of dietary adherence, avoidance of late-night meals and desserts.  Commended on his increased exercise.  Check A1c, 1-month follow-up.  Handout given on prediabetic diet  Essential hypertension - Plan: Hemoglobin A1c, amLODipine (NORVASC) 10 MG tablet, hydrochlorothiazide (HYDRODIURIL) 12.5 MG tablet Benign hypertension  -  Stable, tolerating current regimen. Medications refilled. Labs pending as above.   Hyperlipidemia, unspecified hyperlipidemia type - Plan: Lipid panel  - likely will recommend statin depending on A1c.   Class 2 severe obesity with serious comorbidity and body mass index (BMI) of 38.0 to 38.9 in adult, unspecified obesity type ([place])  - dietary advice as above. Commended on increased walking.   Meds ordered this encounter   Medications   amLODipine (NORVASC) 10 MG tablet    Sig: Take 1 tablet (10 mg total) by mouth daily.    Dispense:  90 tablet    Refill:  1   hydrochlorothiazide (HYDRODIURIL) 12.5 MG tablet    Sig: Take 1 tablet (12.5 mg total) by mouth daily.    Dispense:  90 tablet    Refill:  1   Patient Instructions  Weight has increased. I will check levels again today. If at diabetes range, will likely need new medicine.  Keep up the good work  with walking. Cut back on desserts, late meals. Thanks for coming   Prediabetes Eating Plan Prediabetes is a condition that causes blood sugar (glucose) levels to be higher than normal. This increases the risk for developing type 2 diabetes (type 2 diabetes mellitus). Working with a health care provider or nutrition specialist (dietitian) to make diet and lifestyle changes can help prevent the onset of diabetes. These changes may help you: Control your blood glucose levels. Improve your cholesterol levels. Manage your blood pressure. What are tips for following this plan? Reading food labels Read food labels to check the amount of fat, salt (sodium), and sugar in prepackaged foods. Avoid foods that have: Saturated fats. Trans fats. Added sugars. Avoid foods that have more than 300 milligrams (mg) of sodium per serving. Limit your sodium intake to less than 2,300 mg each day. Shopping Avoid buying pre-made and processed foods. Avoid buying drinks with added sugar. Cooking Cook with olive oil. Do not use butter, lard, or ghee. Bake, broil, grill, steam, or boil foods. Avoid frying. Meal planning  Work with your dietitian to create an eating plan that is right for you. This may include tracking how many calories you take in each day. Use a food diary, notebook, or mobile application to track what you eat at each meal. Consider following a Mediterranean diet. This includes: Eating several servings of fresh fruits and vegetables each day. Eating fish at  least twice a week. Eating one serving each day of whole grains, beans, nuts, and seeds. Using olive oil instead of other fats. Limiting alcohol. Limiting red meat. Using nonfat or low-fat dairy products. Consider following a plant-based diet. This includes dietary choices that focus on eating mostly vegetables and fruit, grains, beans, nuts, and seeds. If you have high blood pressure, you may need to limit your sodium intake or follow a diet such as the DASH (Dietary Approaches to Stop Hypertension) eating plan. The DASH diet aims to lower high blood pressure. Lifestyle Set weight loss goals with help from your health care team. It is recommended that most people with prediabetes lose 7% of their body weight. Exercise for at least 30 minutes 5 or more days a week. Attend a support group or seek support from a mental health counselor. Take over-the-counter and prescription medicines only as told by your health care provider. What foods are recommended? Fruits Berries. Bananas. Apples. Oranges. Grapes. Papaya. Mango. Pomegranate. Kiwi. Grapefruit. Cherries. Vegetables Lettuce. Spinach. Peas. Beets. Cauliflower. Cabbage. Broccoli. Carrots. Tomatoes. Squash. Eggplant. Herbs. Peppers. Onions. Cucumbers. Brussels sprouts. Grains Whole grains, such as whole-wheat or whole-grain breads, crackers, cereals, and pasta. Unsweetened oatmeal. Bulgur. Barley. Quinoa. Brown rice. Corn or whole-wheat flour tortillas or taco shells. Meats and other proteins Seafood. Poultry without skin. Lean cuts of pork and beef. Tofu. Eggs. Nuts. Beans. Dairy Low-fat or fat-free dairy products, such as yogurt, cottage cheese, and cheese. Beverages Water. Tea. Coffee. Sugar-free or diet soda. Seltzer water. Low-fat or nonfat milk. Milk alternatives, such as soy or almond milk. Fats and oils Olive oil. Canola oil. Sunflower oil. Grapeseed oil. Avocado. Walnuts. Sweets and desserts Sugar-free or low-fat pudding.  Sugar-free or low-fat ice cream and other frozen treats. Seasonings and condiments Herbs. Sodium-free spices. Mustard. Relish. Low-salt, low-sugar ketchup. Low-salt, low-sugar barbecue sauce. Low-fat or fat-free mayonnaise. The items listed above may not be a complete list of recommended foods and beverages. Contact a dietitian for more information. What foods are not recommended? Fruits Fruits canned with syrup. Vegetables Canned vegetables.  Frozen vegetables with butter or cream sauce. Grains Refined white flour and flour products, such as bread, pasta, snack foods, and cereals. Meats and other proteins Fatty cuts of meat. Poultry with skin. Breaded or fried meat. Processed meats. Dairy Full-fat yogurt, cheese, or milk. Beverages Sweetened drinks, such as iced tea and soda. Fats and oils Butter. Lard. Ghee. Sweets and desserts Baked goods, such as cake, cupcakes, pastries, cookies, and cheesecake. Seasonings and condiments Spice mixes with added salt. Ketchup. Barbecue sauce. Mayonnaise. The items listed above may not be a complete list of foods and beverages that are not recommended. Contact a dietitian for more information. Where to find more information American Diabetes Association: www.diabetes.org Summary You may need to make diet and lifestyle changes to help prevent the onset of diabetes. These changes can help you control blood sugar, improve cholesterol levels, and manage blood pressure. Set weight loss goals with help from your health care team. It is recommended that most people with prediabetes lose 7% of their body weight. Consider following a Mediterranean diet. This includes eating plenty of fresh fruits and vegetables, whole grains, beans, nuts, seeds, fish, and low-fat dairy, and using olive oil instead of other fats. This information is not intended to replace advice given to you by your health care provider. Make sure you discuss any questions you have with your  health care provider. Document Revised: 10/06/2019 Document Reviewed: 10/06/2019 Elsevier Patient Education  2022 [place],   Merri Ray, MD [place], [facility] 05/27/21 9:26 AM

## 2021-05-27 NOTE — Patient Instructions (Addendum)
Weight has increased. I will check levels again today. If at diabetes range, will likely need new medicine.  Keep up the good work with walking. Cut back on desserts, late meals. Thanks for coming   Prediabetes Eating Plan Prediabetes is a condition that causes blood sugar (glucose) levels to be higher than normal. This increases the risk for developing type 2 diabetes (type 2 diabetes mellitus). Working with a health care provider or nutrition specialist (dietitian) to make diet and lifestyle changes can help prevent the onset of diabetes. These changes may help you: Control your blood glucose levels. Improve your cholesterol levels. Manage your blood pressure. What are tips for following this plan? Reading food labels Read food labels to check the amount of fat, salt (sodium), and sugar in prepackaged foods. Avoid foods that have: Saturated fats. Trans fats. Added sugars. Avoid foods that have more than 300 milligrams (mg) of sodium per serving. Limit your sodium intake to less than 2,300 mg each day. Shopping Avoid buying pre-made and processed foods. Avoid buying drinks with added sugar. Cooking Cook with olive oil. Do not use butter, lard, or ghee. Bake, broil, grill, steam, or boil foods. Avoid frying. Meal planning  Work with your dietitian to create an eating plan that is right for you. This may include tracking how many calories you take in each day. Use a food diary, notebook, or mobile application to track what you eat at each meal. Consider following a Mediterranean diet. This includes: Eating several servings of fresh fruits and vegetables each day. Eating fish at least twice a week. Eating one serving each day of whole grains, beans, nuts, and seeds. Using olive oil instead of other fats. Limiting alcohol. Limiting red meat. Using nonfat or low-fat dairy products. Consider following a plant-based diet. This includes dietary choices that focus on eating mostly vegetables  and fruit, grains, beans, nuts, and seeds. If you have high blood pressure, you may need to limit your sodium intake or follow a diet such as the DASH (Dietary Approaches to Stop Hypertension) eating plan. The DASH diet aims to lower high blood pressure. Lifestyle Set weight loss goals with help from your health care team. It is recommended that most people with prediabetes lose 7% of their body weight. Exercise for at least 30 minutes 5 or more days a week. Attend a support group or seek support from a mental health counselor. Take over-the-counter and prescription medicines only as told by your health care provider. What foods are recommended? Fruits Berries. Bananas. Apples. Oranges. Grapes. Papaya. Mango. Pomegranate. Kiwi. Grapefruit. Cherries. Vegetables Lettuce. Spinach. Peas. Beets. Cauliflower. Cabbage. Broccoli. Carrots. Tomatoes. Squash. Eggplant. Herbs. Peppers. Onions. Cucumbers. Brussels sprouts. Grains Whole grains, such as whole-wheat or whole-grain breads, crackers, cereals, and pasta. Unsweetened oatmeal. Bulgur. Barley. Quinoa. Brown rice. Corn or whole-wheat flour tortillas or taco shells. Meats and other proteins Seafood. Poultry without skin. Lean cuts of pork and beef. Tofu. Eggs. Nuts. Beans. Dairy Low-fat or fat-free dairy products, such as yogurt, cottage cheese, and cheese. Beverages Water. Tea. Coffee. Sugar-free or diet soda. Seltzer water. Low-fat or nonfat milk. Milk alternatives, such as soy or almond milk. Fats and oils Olive oil. Canola oil. Sunflower oil. Grapeseed oil. Avocado. Walnuts. Sweets and desserts Sugar-free or low-fat pudding. Sugar-free or low-fat ice cream and other frozen treats. Seasonings and condiments Herbs. Sodium-free spices. Mustard. Relish. Low-salt, low-sugar ketchup. Low-salt, low-sugar barbecue sauce. Low-fat or fat-free mayonnaise. The items listed above may not be a complete list of  recommended foods and beverages. Contact a  dietitian for more information. What foods are not recommended? Fruits Fruits canned with syrup. Vegetables Canned vegetables. Frozen vegetables with butter or cream sauce. Grains Refined white flour and flour products, such as bread, pasta, snack foods, and cereals. Meats and other proteins Fatty cuts of meat. Poultry with skin. Breaded or fried meat. Processed meats. Dairy Full-fat yogurt, cheese, or milk. Beverages Sweetened drinks, such as iced tea and soda. Fats and oils Butter. Lard. Ghee. Sweets and desserts Baked goods, such as cake, cupcakes, pastries, cookies, and cheesecake. Seasonings and condiments Spice mixes with added salt. Ketchup. Barbecue sauce. Mayonnaise. The items listed above may not be a complete list of foods and beverages that are not recommended. Contact a dietitian for more information. Where to find more information American Diabetes Association: www.diabetes.org Summary You may need to make diet and lifestyle changes to help prevent the onset of diabetes. These changes can help you control blood sugar, improve cholesterol levels, and manage blood pressure. Set weight loss goals with help from your health care team. It is recommended that most people with prediabetes lose 7% of their body weight. Consider following a Mediterranean diet. This includes eating plenty of fresh fruits and vegetables, whole grains, beans, nuts, seeds, fish, and low-fat dairy, and using olive oil instead of other fats. This information is not intended to replace advice given to you by your health care provider. Make sure you discuss any questions you have with your health care provider. Document Revised: 10/06/2019 Document Reviewed: 10/06/2019 Elsevier Patient Education  2022 [employer].

## 2021-08-28 ENCOUNTER — Ambulatory Visit: Payer: No Typology Code available for payment source | Admitting: Family Medicine

## 2021-08-28 VITALS — BP 130/70 | HR 90 | Temp 98.2°F | Resp 16 | Ht 67.0 in | Wt 245.8 lb

## 2021-08-28 DIAGNOSIS — E1169 Type 2 diabetes mellitus with other specified complication: Secondary | ICD-10-CM

## 2021-08-28 DIAGNOSIS — E7800 Pure hypercholesterolemia, unspecified: Secondary | ICD-10-CM | POA: Diagnosis not present

## 2021-08-28 DIAGNOSIS — I10 Essential (primary) hypertension: Secondary | ICD-10-CM

## 2021-08-28 DIAGNOSIS — E669 Obesity, unspecified: Secondary | ICD-10-CM | POA: Diagnosis not present

## 2021-08-28 LAB — POCT GLYCOSYLATED HEMOGLOBIN (HGB A1C): Hemoglobin A1C: 6.4 % — AB (ref 4.0–5.6)

## 2021-08-28 MED ORDER — ATORVASTATIN CALCIUM 10 MG PO TABS: 10.0000 mg | ORAL_TABLET | ORAL | 0 refills | Status: DC

## 2021-08-28 MED ORDER — HYDROCHLOROTHIAZIDE 12.5 MG PO TABS: 12.5000 mg | ORAL_TABLET | Freq: Every day | ORAL | 1 refills | Status: DC

## 2021-08-28 MED ORDER — AMLODIPINE BESYLATE 10 MG PO TABS: 10.0000 mg | ORAL_TABLET | Freq: Every day | ORAL | 1 refills | Status: DC

## 2021-08-28 NOTE — Patient Instructions (Addendum)
I will let you know the results of your blood sugar later.  Start Lipitor twice per week.  If any new side effects let me know.  6-week lab visit, then see me in 3 months.  Try to increase exercise as weather tolerates and see information on prediabetes diet again below.  Thanks for coming in today.   Prediabetes Eating Plan Prediabetes is a condition that causes blood sugar (glucose) levels to be higher than normal. This increases the risk for developing type 2 diabetes (type 2 diabetes mellitus). Working with a health care provider or nutrition specialist (dietitian) to make diet and lifestyle changes can help prevent the onset of diabetes. These changes may help you: Control your blood glucose levels. Improve your cholesterol levels. Manage your blood pressure. What are tips for following this plan? Reading food labels Read food labels to check the amount of fat, salt (sodium), and sugar in prepackaged foods. Avoid foods that have: Saturated fats. Trans fats. Added sugars. Avoid foods that have more than 300 milligrams (mg) of sodium per serving. Limit your sodium intake to less than 2,300 mg each day. Shopping Avoid buying pre-made and processed foods. Avoid buying drinks with added sugar. Cooking Cook with olive oil. Do not use butter, lard, or ghee. Bake, broil, grill, steam, or boil foods. Avoid frying. Meal planning  Work with your dietitian to create an eating plan that is right for you. This may include tracking how many calories you take in each day. Use a food diary, notebook, or mobile application to track what you eat at each meal. Consider following a Mediterranean diet. This includes: Eating several servings of fresh fruits and vegetables each day. Eating fish at least twice a week. Eating one serving each day of whole grains, beans, nuts, and seeds. Using olive oil instead of other fats. Limiting alcohol. Limiting red meat. Using nonfat or low-fat dairy  products. Consider following a plant-based diet. This includes dietary choices that focus on eating mostly vegetables and fruit, grains, beans, nuts, and seeds. If you have high blood pressure, you may need to limit your sodium intake or follow a diet such as the DASH (Dietary Approaches to Stop Hypertension) eating plan. The DASH diet aims to lower high blood pressure. Lifestyle Set weight loss goals with help from your health care team. It is recommended that most people with prediabetes lose 7% of their body weight. Exercise for at least 30 minutes 5 or more days a week. Attend a support group or seek support from a mental health counselor. Take over-the-counter and prescription medicines only as told by your health care provider. What foods are recommended? Fruits Berries. Bananas. Apples. Oranges. Grapes. Papaya. Mango. Pomegranate. Kiwi. Grapefruit. Cherries. Vegetables Lettuce. Spinach. Peas. Beets. Cauliflower. Cabbage. Broccoli. Carrots. Tomatoes. Squash. Eggplant. Herbs. Peppers. Onions. Cucumbers. Brussels sprouts. Grains Whole grains, such as whole-wheat or whole-grain breads, crackers, cereals, and pasta. Unsweetened oatmeal. Bulgur. Barley. Quinoa. Brown rice. Corn or whole-wheat flour tortillas or taco shells. Meats and other proteins Seafood. Poultry without skin. Lean cuts of pork and beef. Tofu. Eggs. Nuts. Beans. Dairy Low-fat or fat-free dairy products, such as yogurt, cottage cheese, and cheese. Beverages Water. Tea. Coffee. Sugar-free or diet soda. Seltzer water. Low-fat or nonfat milk. Milk alternatives, such as soy or almond milk. Fats and oils Olive oil. Canola oil. Sunflower oil. Grapeseed oil. Avocado. Walnuts. Sweets and desserts Sugar-free or low-fat pudding. Sugar-free or low-fat ice cream and other frozen treats. Seasonings and condiments Herbs. Sodium-free spices.  Mustard. Relish. Low-salt, low-sugar ketchup. Low-salt, low-sugar barbecue sauce. Low-fat or  fat-free mayonnaise. The items listed above may not be a complete list of recommended foods and beverages. Contact a dietitian for more information. What foods are not recommended? Fruits Fruits canned with syrup. Vegetables Canned vegetables. Frozen vegetables with butter or cream sauce. Grains Refined white flour and flour products, such as bread, pasta, snack foods, and cereals. Meats and other proteins Fatty cuts of meat. Poultry with skin. Breaded or fried meat. Processed meats. Dairy Full-fat yogurt, cheese, or milk. Beverages Sweetened drinks, such as iced tea and soda. Fats and oils Butter. Lard. Ghee. Sweets and desserts Baked goods, such as cake, cupcakes, pastries, cookies, and cheesecake. Seasonings and condiments Spice mixes with added salt. Ketchup. Barbecue sauce. Mayonnaise. The items listed above may not be a complete list of foods and beverages that are not recommended. Contact a dietitian for more information. Where to find more information American Diabetes Association: www.diabetes.org Summary You may need to make diet and lifestyle changes to help prevent the onset of diabetes. These changes can help you control blood sugar, improve cholesterol levels, and manage blood pressure. Set weight loss goals with help from your health care team. It is recommended that most people with prediabetes lose 7% of their body weight. Consider following a Mediterranean diet. This includes eating plenty of fresh fruits and vegetables, whole grains, beans, nuts, seeds, fish, and low-fat dairy, and using olive oil instead of other fats. This information is not intended to replace advice given to you by your health care provider. Make sure you discuss any questions you have with your health care provider. Document Revised: 10/06/2019 Document Reviewed: 10/06/2019 Elsevier Patient Education  2022 [employer].

## 2021-08-28 NOTE — Progress Notes (Signed)
Subjective:  Patient ID: Adrian Alvarez, male    DOB: 1975/12/08  Age: 46 y.o. MRN: [id number]  CC:  Chief Complaint  Patient presents with   Hypertension    Pt here for BP check no concerns he has been doing well    Results    Pt did not review lab work on [employer] and would like to discuss today     HPI Adrian Alvarez presents for   Hypertension: Amlodipine 10 mg, hydrochlorothiazide 12.5 mg, losartan 25 mg daily. No new med side effects. 130/70, no new side effects.  BP Readings from Last 3 Encounters:  08/28/21 130/70  05/27/21 118/72  04/26/21 116/78   Lab Results  Component Value Date   CREATININE 0.79 05/27/2021   Prediabetes: Previous history of prediabetes, unfortunately A1c in November was at diabetic level at 6.6. Prediabetes eating plan prescribed at that visit, dietary adherence and avoidance of late-night meals, desserts were discussed.  Was increasing exercise at that time and commended on his changes. Recommended starting a statin at last visit.  Have not heard from him since lab notes. Less exercise. Some walking at work, less outside with winter.  Statin discussed - would like to start with intermittent dosing initially.  No home readings, no diet changes.   The 10-year ASCVD risk score (Arnett DK, et al., 2019) is: 6.6%   Values used to calculate the score:     Age: 24 years     Sex: Male     Is Non-Hispanic African American: No     Diabetic: Yes     Tobacco smoker: No     Systolic Blood Pressure: 130 mmHg     Is BP treated: Yes     HDL Cholesterol: 44.8 mg/dL     Total Cholesterol: 200 mg/dL   Lab Results  Component Value Date   HGBA1C 6.4 (A) 08/28/2021   Wt Readings from Last 3 Encounters:  08/28/21 245 lb 12.8 oz (111.5 kg)  05/27/21 244 lb (110.7 kg)  04/26/21 242 lb 9.6 oz (110 kg)   Lab Results  Component Value Date   CHOL 200 05/27/2021   HDL 44.80 05/27/2021   LDLCALC 132 (H) 05/27/2021   TRIG 116.0 05/27/2021   CHOLHDL 4  05/27/2021      History Patient Active Problem List   Diagnosis Date Noted   Pre-diabetes 01/23/2021   Past Medical History:  Diagnosis Date   Diabetes (HCC)    Hyperlipidemia    Hypertension    Pre-diabetes    Tuberculosis    Past Surgical History:  Procedure Laterality Date   THORACENTESIS  2000   No Known Allergies Prior to Admission medications   Medication Sig Start Date End Date Taking? Authorizing Provider  amLODipine (NORVASC) 10 MG tablet Take 1 tablet (10 mg total) by mouth daily. 05/27/21  Yes Shade Flood, MD  hydrochlorothiazide (HYDRODIURIL) 12.5 MG tablet Take 1 tablet (12.5 mg total) by mouth daily. 05/27/21  Yes Shade Flood, MD  losartan (COZAAR) 25 MG tablet Take 1 tablet (25 mg total) by mouth every morning. 04/26/21 10/23/21 Yes Tolia, Sunit, DO  Multiple Vitamin (MULTIVITAMIN) tablet Take 1 tablet by mouth daily.   Yes [provider]   Social History   Socioeconomic History   Marital status: Married    Spouse name: Not on file   Number of children: 1   Years of education: Not on file   Highest education level: Not on file  Occupational History  Not on file  Tobacco Use   Smoking status: Never   Smokeless tobacco: Never  Vaping Use   Vaping Use: Never used  Substance and Sexual Activity   Alcohol use: Yes    Alcohol/week: 6.0 standard drinks    Types: 6 Standard drinks or equivalent per week    Comment: occ   Drug use: No   Sexual activity: Not on file  Other Topics Concern   Not on file  Social History Narrative   Not on file   Social Determinants of Health   Financial Resource Strain: Not on file  Food Insecurity: Not on file  Transportation Needs: Not on file  Physical Activity: Not on file  Stress: Not on file  Social Connections: Not on file  Intimate Partner Violence: Not on file    Review of Systems  Constitutional:  Negative for fatigue and unexpected weight change.  Eyes:  Negative for visual  disturbance.  Respiratory:  Negative for cough, chest tightness and shortness of breath.   Cardiovascular:  Negative for chest pain, palpitations and leg swelling.  Gastrointestinal:  Negative for abdominal pain and blood in stool.  Neurological:  Negative for dizziness, light-headedness and headaches.    Objective:   Vitals:   08/28/21 1546  BP: 130/70  Pulse: 90  Resp: 16  Temp: 98.2 F (36.8 C)  TempSrc: Temporal  SpO2: 98%  Weight: 245 lb 12.8 oz (111.5 kg)  Height: 5\' 7"  (1.702 m)     Physical Exam Vitals reviewed.  Constitutional:      Appearance: He is well-developed.  HENT:     Head: Normocephalic and atraumatic.  Neck:     Vascular: No carotid bruit or JVD.  Cardiovascular:     Rate and Rhythm: Normal rate and regular rhythm.     Heart sounds: Normal heart sounds. No murmur heard. Pulmonary:     Effort: Pulmonary effort is normal.     Breath sounds: Normal breath sounds. No rales.  Musculoskeletal:     Right lower leg: No edema.     Left lower leg: No edema.  Skin:    General: Skin is warm and dry.  Neurological:     Mental Status: He is alert and oriented to person, place, and time.  Psychiatric:        Mood and Affect: Mood normal.       Assessment & Plan:  Adrian Alvarez is a 46 y.o. male . Type 2 diabetes mellitus with obesity (HCC) - Plan: atorvastatin (LIPITOR) 10 MG tablet, POCT glycosylated hemoglobin (Hb A1C)  Essential hypertension - Plan: amLODipine (NORVASC) 10 MG tablet, hydrochlorothiazide (HYDRODIURIL) 12.5 MG tablet  Elevated LDL cholesterol level - Plan: atorvastatin (LIPITOR) 10 MG tablet, Comprehensive metabolic panel, Lipid panel  A1c improved. Just below diabetic level. No new meds for now. Will try intermittent dosing of statin, watch diet, exercise, handout given on prediabetes diet and recheck in 3 months.  6-week recheck of labs with start of statin.  Meds ordered this encounter  Medications   amLODipine (NORVASC) 10 MG  tablet    Sig: Take 1 tablet (10 mg total) by mouth daily.    Dispense:  90 tablet    Refill:  1   hydrochlorothiazide (HYDRODIURIL) 12.5 MG tablet    Sig: Take 1 tablet (12.5 mg total) by mouth daily.    Dispense:  90 tablet    Refill:  1   atorvastatin (LIPITOR) 10 MG tablet    Sig: Take 1 tablet (  10 mg total) by mouth 2 (two) times a week.    Dispense:  30 tablet    Refill:  0   Patient Instructions  I will let you know the results of your blood sugar later.  Start Lipitor twice per week.  If any new side effects let me know.  6-week lab visit, then see me in 3 months.  Try to increase exercise as weather tolerates and see information on prediabetes diet again below.  Thanks for coming in today.   Prediabetes Eating Plan Prediabetes is a condition that causes blood sugar (glucose) levels to be higher than normal. This increases the risk for developing type 2 diabetes (type 2 diabetes mellitus). Working with a health care provider or nutrition specialist (dietitian) to make diet and lifestyle changes can help prevent the onset of diabetes. These changes may help you: Control your blood glucose levels. Improve your cholesterol levels. Manage your blood pressure. What are tips for following this plan? Reading food labels Read food labels to check the amount of fat, salt (sodium), and sugar in prepackaged foods. Avoid foods that have: Saturated fats. Trans fats. Added sugars. Avoid foods that have more than 300 milligrams (mg) of sodium per serving. Limit your sodium intake to less than 2,300 mg each day. Shopping Avoid buying pre-made and processed foods. Avoid buying drinks with added sugar. Cooking Cook with olive oil. Do not use butter, lard, or ghee. Bake, broil, grill, steam, or boil foods. Avoid frying. Meal planning  Work with your dietitian to create an eating plan that is right for you. This may include tracking how many calories you take in each day. Use a food diary,  notebook, or mobile application to track what you eat at each meal. Consider following a Mediterranean diet. This includes: Eating several servings of fresh fruits and vegetables each day. Eating fish at least twice a week. Eating one serving each day of whole grains, beans, nuts, and seeds. Using olive oil instead of other fats. Limiting alcohol. Limiting red meat. Using nonfat or low-fat dairy products. Consider following a plant-based diet. This includes dietary choices that focus on eating mostly vegetables and fruit, grains, beans, nuts, and seeds. If you have high blood pressure, you may need to limit your sodium intake or follow a diet such as the DASH (Dietary Approaches to Stop Hypertension) eating plan. The DASH diet aims to lower high blood pressure. Lifestyle Set weight loss goals with help from your health care team. It is recommended that most people with prediabetes lose 7% of their body weight. Exercise for at least 30 minutes 5 or more days a week. Attend a support group or seek support from a mental health counselor. Take over-the-counter and prescription medicines only as told by your health care provider. What foods are recommended? Fruits Berries. Bananas. Apples. Oranges. Grapes. Papaya. Mango. Pomegranate. Kiwi. Grapefruit. Cherries. Vegetables Lettuce. Spinach. Peas. Beets. Cauliflower. Cabbage. Broccoli. Carrots. Tomatoes. Squash. Eggplant. Herbs. Peppers. Onions. Cucumbers. Brussels sprouts. Grains Whole grains, such as whole-wheat or whole-grain breads, crackers, cereals, and pasta. Unsweetened oatmeal. Bulgur. Barley. Quinoa. Brown rice. Corn or whole-wheat flour tortillas or taco shells. Meats and other proteins Seafood. Poultry without skin. Lean cuts of pork and beef. Tofu. Eggs. Nuts. Beans. Dairy Low-fat or fat-free dairy products, such as yogurt, cottage cheese, and cheese. Beverages Water. Tea. Coffee. Sugar-free or diet soda. Seltzer water. Low-fat or  nonfat milk. Milk alternatives, such as soy or almond milk. Fats and oils Olive oil. Canola  oil. Sunflower oil. Grapeseed oil. Avocado. Walnuts. Sweets and desserts Sugar-free or low-fat pudding. Sugar-free or low-fat ice cream and other frozen treats. Seasonings and condiments Herbs. Sodium-free spices. Mustard. Relish. Low-salt, low-sugar ketchup. Low-salt, low-sugar barbecue sauce. Low-fat or fat-free mayonnaise. The items listed above may not be a complete list of recommended foods and beverages. Contact a dietitian for more information. What foods are not recommended? Fruits Fruits canned with syrup. Vegetables Canned vegetables. Frozen vegetables with butter or cream sauce. Grains Refined white flour and flour products, such as bread, pasta, snack foods, and cereals. Meats and other proteins Fatty cuts of meat. Poultry with skin. Breaded or fried meat. Processed meats. Dairy Full-fat yogurt, cheese, or milk. Beverages Sweetened drinks, such as iced tea and soda. Fats and oils Butter. Lard. Ghee. Sweets and desserts Baked goods, such as cake, cupcakes, pastries, cookies, and cheesecake. Seasonings and condiments Spice mixes with added salt. Ketchup. Barbecue sauce. Mayonnaise. The items listed above may not be a complete list of foods and beverages that are not recommended. Contact a dietitian for more information. Where to find more information American Diabetes Association: www.diabetes.org Summary You may need to make diet and lifestyle changes to help prevent the onset of diabetes. These changes can help you control blood sugar, improve cholesterol levels, and manage blood pressure. Set weight loss goals with help from your health care team. It is recommended that most people with prediabetes lose 7% of their body weight. Consider following a Mediterranean diet. This includes eating plenty of fresh fruits and vegetables, whole grains, beans, nuts, seeds, fish, and low-fat  dairy, and using olive oil instead of other fats. This information is not intended to replace advice given to you by your health care provider. Make sure you discuss any questions you have with your health care provider. Document Revised: 10/06/2019 Document Reviewed: 10/06/2019 Elsevier Patient Education  2022 Elsevier Inc.       Signed,   Meredith StaggersJeffrey Demira Gwynne, MD [facility], [facility] 08/28/21 5:32 PM

## 2021-10-09 ENCOUNTER — Other Ambulatory Visit: Payer: No Typology Code available for payment source

## 2021-10-10 ENCOUNTER — Other Ambulatory Visit (INDEPENDENT_AMBULATORY_CARE_PROVIDER_SITE_OTHER): Payer: No Typology Code available for payment source

## 2021-10-10 DIAGNOSIS — E7800 Pure hypercholesterolemia, unspecified: Secondary | ICD-10-CM | POA: Diagnosis not present

## 2021-10-11 LAB — LIPID PANEL
Cholesterol: 159 mg/dL (ref 0–200)
HDL: 41.7 mg/dL (ref >39.00)
LDL Cholesterol: 84 mg/dL (ref 0–99)
NonHDL: 117.34
Total CHOL/HDL Ratio: 4
Triglycerides: 165 mg/dL — ABNORMAL HIGH (ref 0.0–149.0)
VLDL: 33 mg/dL (ref 0.0–40.0)

## 2021-10-11 LAB — COMPREHENSIVE METABOLIC PANEL
ALT: 47 U/L (ref 0–53)
AST: 26 U/L (ref 0–37)
Albumin: 4.6 g/dL (ref 3.5–5.2)
Alkaline Phosphatase: 68 U/L (ref 39–117)
BUN: 15 mg/dL (ref 6–23)
CO2: 23 mEq/L (ref 19–32)
Calcium: 9.7 mg/dL (ref 8.4–10.5)
Chloride: 104 mEq/L (ref 96–112)
Creatinine, Ser: 0.72 mg/dL (ref 0.40–1.50)
GFR: 108.82 mL/min (ref >60.00)
Glucose, Bld: 101 mg/dL — ABNORMAL HIGH (ref 70–99)
Potassium: 4.1 mEq/L (ref 3.5–5.1)
Sodium: 137 mEq/L (ref 135–145)
Total Bilirubin: 0.5 mg/dL (ref 0.2–1.2)
Total Protein: 7 g/dL (ref 6.0–8.3)

## 2021-10-28 ENCOUNTER — Other Ambulatory Visit: Payer: Self-pay

## 2021-10-28 DIAGNOSIS — I10 Essential (primary) hypertension: Secondary | ICD-10-CM

## 2021-10-28 MED ORDER — LOSARTAN POTASSIUM 25 MG PO TABS: 25.0000 mg | ORAL_TABLET | Freq: Every morning | ORAL | 1 refills | Status: DC

## 2021-11-25 ENCOUNTER — Ambulatory Visit: Payer: No Typology Code available for payment source | Admitting: Family Medicine

## 2021-11-25 DIAGNOSIS — E1169 Type 2 diabetes mellitus with other specified complication: Secondary | ICD-10-CM | POA: Diagnosis not present

## 2021-11-25 DIAGNOSIS — E7800 Pure hypercholesterolemia, unspecified: Secondary | ICD-10-CM

## 2021-11-25 DIAGNOSIS — E669 Obesity, unspecified: Secondary | ICD-10-CM | POA: Diagnosis not present

## 2021-11-25 MED ORDER — ATORVASTATIN CALCIUM 10 MG PO TABS: 10.0000 mg | ORAL_TABLET | ORAL | 1 refills | Status: DC

## 2021-11-25 NOTE — Patient Instructions (Addendum)
Continue to watch diet, try to avoid fast food and fried foods.  Keep up the good work with avoiding sugar-containing beverages.  Make sure to drink plenty of water.  Walking or low intensity exercise most days per week with a goal of 150 minutes/week.  Recheck in 3 months and we can do some fasting labs including blood sugar at that time.  Take care! ?

## 2021-11-25 NOTE — Progress Notes (Signed)
? ?Subjective:  ?Patient ID: Adrian Alvarez, male    DOB: [birth year]  Age: 46 y.o. MRN: [id number] ? ?CC:  ?Chief Complaint  ?Patient presents with  ? Hyperlipidemia  ?  Due for recheck no concerns   ? Diabetes  ?  Due for recheck , notes no concerns   ? ? ?HPI ?Adrian Alvarez presents for  ? ?Diabetes: ?With prior history of prediabetes.  Last visit February 8.  A1c improved at that time to 6.4.  No new meds at that time.   ?Some walking at times, has treadmill available if needed. Walking at works.  ?Less soda, coke zero. Still some fried food, less fast fast food.  ? ?Wt Readings from Last 3 Encounters:  ?11/25/21 244 lb 3.2 oz (110.8 kg)  ?08/28/21 245 lb 12.8 oz (111.5 kg)  ?05/27/21 244 lb (110.7 kg)  ? ? ?Lab Results  ?Component Value Date  ? HGBA1C 6.4 (A) 08/28/2021  ? HGBA1C 6.6 (H) 05/27/2021  ? HGBA1C 6.5 (H) 11/29/2020  ? ?Lab Results  ?Component Value Date  ? LDLCALC 84 10/10/2021  ? CREATININE 0.72 10/10/2021  ? ? ? ?Hyperlipidemia: ?Started on Lipitor 10 mg 08/28/21. Taking 2 times per week. No new mm aches. Updated labs in March improved with LDL 84. ?Needs refill.  ?Lab Results  ?Component Value Date  ? CHOL 159 10/10/2021  ? HDL 41.70 10/10/2021  ? LDLCALC 84 10/10/2021  ? TRIG 165.0 (H) 10/10/2021  ? CHOLHDL 4 10/10/2021  ? ?Lab Results  ?Component Value Date  ? ALT 47 10/10/2021  ? AST 26 10/10/2021  ? GGT 65 11/29/2020  ? ALKPHOS 68 10/10/2021  ? BILITOT 0.5 10/10/2021  ? ? ? ?History ?Patient Active Problem List  ? Diagnosis Date Noted  ? Pre-diabetes 01/23/2021  ? ?Past Medical History:  ?Diagnosis Date  ? Diabetes (HCC)   ? Hyperlipidemia   ? Hypertension   ? Pre-diabetes   ? Tuberculosis   ? ?Past Surgical History:  ?Procedure Laterality Date  ? THORACENTESIS  2000  ? ?No Known Allergies ?Prior to Admission medications   ?Medication Sig Start Date End Date Taking? Authorizing Provider  ?amLODipine (NORVASC) 10 MG tablet Take 1 tablet (10 mg total) by mouth daily. 08/28/21  Yes Shade Flood,  MD  ?atorvastatin (LIPITOR) 10 MG tablet Take 1 tablet (10 mg total) by mouth 2 (two) times a week. 08/29/21  Yes Shade Flood, MD  ?hydrochlorothiazide (HYDRODIURIL) 12.5 MG tablet Take 1 tablet (12.5 mg total) by mouth daily. 08/28/21  Yes Shade Flood, MD  ?losartan (COZAAR) 25 MG tablet Take 1 tablet (25 mg total) by mouth every morning. 10/28/21 04/26/22 Yes Tolia, Sunit, DO  ?Multiple Vitamin (MULTIVITAMIN) tablet Take 1 tablet by mouth daily.   Yes [provider]  ? ?Social History  ? ?Socioeconomic History  ? Marital status: Married  ?  Spouse name: Not on file  ? Number of children: 1  ? Years of education: Not on file  ? Highest education level: Not on file  ?Occupational History  ? Not on file  ?Tobacco Use  ? Smoking status: Never  ? Smokeless tobacco: Never  ?Vaping Use  ? Vaping Use: Never used  ?Substance and Sexual Activity  ? Alcohol use: Yes  ?  Alcohol/week: 6.0 standard drinks  ?  Types: 6 Standard drinks or equivalent per week  ?  Comment: occ  ? Drug use: No  ? Sexual activity: Not on file  ?Other Topics  Concern  ? Not on file  ?Social History Narrative  ? Not on file  ? ?Social Determinants of Health  ? ?Financial Resource Strain: Not on file  ?Food Insecurity: Not on file  ?Transportation Needs: Not on file  ?Physical Activity: Not on file  ?Stress: Not on file  ?Social Connections: Not on file  ?Intimate Partner Violence: Not on file  ? ? ?Review of Systems  ?Constitutional:  Negative for fatigue and unexpected weight change.  ?Eyes:  Negative for visual disturbance.  ?Respiratory:  Negative for cough, chest tightness and shortness of breath.   ?Cardiovascular:  Negative for chest pain, palpitations and leg swelling.  ?Gastrointestinal:  Negative for abdominal pain and blood in stool.  ?Neurological:  Negative for dizziness, light-headedness and headaches.  ? ? ?Objective:  ? ?Vitals:  ? 11/25/21 1615  ?BP: 124/66  ?Pulse: 91  ?Resp: 16  ?Temp: 98.2 ?F (36.8 ?C)  ?TempSrc:  Temporal  ?SpO2: 97%  ?Weight: 244 lb 3.2 oz (110.8 kg)  ?Height: 5\' 7"  (1.702 m)  ? ? ? ?Physical Exam ?Vitals reviewed.  ?Constitutional:   ?   Appearance: He is well-developed.  ?HENT:  ?   Head: Normocephalic and atraumatic.  ?Neck:  ?   Vascular: No carotid bruit or JVD.  ?Cardiovascular:  ?   Rate and Rhythm: Normal rate and regular rhythm.  ?   Heart sounds: Normal heart sounds. No murmur heard. ?Pulmonary:  ?   Effort: Pulmonary effort is normal.  ?   Breath sounds: Normal breath sounds. No rales.  ?Musculoskeletal:  ?   Right lower leg: No edema.  ?   Left lower leg: No edema.  ?Skin: ?   General: Skin is warm and dry.  ?Neurological:  ?   Mental Status: He is alert and oriented to person, place, and time.  ?Psychiatric:     ?   Mood and Affect: Mood normal.  ? ? ? ? ? ?Assessment & Plan:  ?Adrian Alvarez is a 46 y.o. male . ?Type 2 diabetes mellitus with obesity (HCC) - Plan: atorvastatin (LIPITOR) 10 MG tablet ? ?Elevated LDL cholesterol level - Plan: atorvastatin (LIPITOR) 10 MG tablet ? ?Tolerating statin with improved readings noted above.  Increased exercise planned for diabetes.  No new meds for now.  Recheck in 3 months for fasting lab work.  Continue to watch diet. ? ?Meds ordered this encounter  ?Medications  ? atorvastatin (LIPITOR) 10 MG tablet  ?  Sig: Take 1 tablet (10 mg total) by mouth 2 (two) times a week.  ?  Dispense:  30 tablet  ?  Refill:  1  ? ?Patient Instructions  ?Continue to watch diet, try to avoid fast food and fried foods.  Keep up the good work with avoiding sugar-containing beverages.  Make sure to drink plenty of water.  Walking or low intensity exercise most days per week with a goal of 150 minutes/week.  Recheck in 3 months and we can do some fasting labs including blood sugar at that time.  Take care! ? ? ? ?Signed,  ? ?55, MD ?[facility], [facility] ?[facility] ?11/26/21 ?10:06 AM ? ? ?

## 2022-02-26 ENCOUNTER — Ambulatory Visit: Payer: No Typology Code available for payment source | Admitting: Family Medicine

## 2022-04-07 ENCOUNTER — Ambulatory Visit: Payer: No Typology Code available for payment source | Admitting: Family Medicine

## 2022-04-07 ENCOUNTER — Other Ambulatory Visit: Payer: Self-pay

## 2022-04-07 VITALS — HR 87 | Temp 98.2°F | Resp 18 | Ht 67.0 in | Wt 246.0 lb

## 2022-04-07 DIAGNOSIS — R7303 Prediabetes: Secondary | ICD-10-CM

## 2022-04-07 DIAGNOSIS — E1169 Type 2 diabetes mellitus with other specified complication: Secondary | ICD-10-CM | POA: Diagnosis not present

## 2022-04-07 DIAGNOSIS — E669 Obesity, unspecified: Secondary | ICD-10-CM

## 2022-04-07 DIAGNOSIS — I10 Essential (primary) hypertension: Secondary | ICD-10-CM

## 2022-04-07 DIAGNOSIS — E7800 Pure hypercholesterolemia, unspecified: Secondary | ICD-10-CM

## 2022-04-07 DIAGNOSIS — E785 Hyperlipidemia, unspecified: Secondary | ICD-10-CM

## 2022-04-07 MED ORDER — HYDROCHLOROTHIAZIDE 12.5 MG PO TABS: 12.5000 mg | ORAL_TABLET | Freq: Every day | ORAL | 1 refills | Status: DC

## 2022-04-07 MED ORDER — AMLODIPINE BESYLATE 10 MG PO TABS: 10.0000 mg | ORAL_TABLET | Freq: Every day | ORAL | 1 refills | Status: DC

## 2022-04-07 MED ORDER — LOSARTAN POTASSIUM 25 MG PO TABS: 25.0000 mg | ORAL_TABLET | Freq: Every morning | ORAL | 1 refills | Status: DC

## 2022-04-07 MED ORDER — ATORVASTATIN CALCIUM 10 MG PO TABS: 10.0000 mg | ORAL_TABLET | ORAL | 1 refills | Status: DC

## 2022-04-07 NOTE — Progress Notes (Unsigned)
Subjective:  Patient ID: Adrian Alvarez, male    DOB: 10/06/1975  Age: 46 y.o. MRN: [id number]  CC:  Chief Complaint  Patient presents with   Follow-up    Patient states he is here for 3 month follow up.    HPI Adrian Alvarez presents for   Diabetes:  With prior history of prediabetes then elevated A1c at diabetic level previously.  A1c had improved in February.  Continue diet approach.  Cut back on sodas - not daily. Min fast food.  Exercise 5 times per week, 1 hour.  Weight up 2 pounds.   Travel to [place] a month ago, some dietary changes then.   Wt Readings from Last 3 Encounters:  04/07/22 246 lb (111.6 kg)  11/25/21 244 lb 3.2 oz (110.8 kg)  08/28/21 245 lb 12.8 oz (111.5 kg)   Lab Results  Component Value Date   HGBA1C 6.4 (A) 08/28/2021   HGBA1C 6.6 (H) 05/27/2021   HGBA1C 6.5 (H) 11/29/2020   Lab Results  Component Value Date   LDLCALC 84 10/10/2021   CREATININE 0.72 10/10/2021   Hyperlipidemia: Lipitor 10 mg few days per week - 2 days per week. No myalgia or new side effects.  Lab Results  Component Value Date   CHOL 159 10/10/2021   HDL 41.70 10/10/2021   LDLCALC 84 10/10/2021   TRIG 165.0 (H) 10/10/2021   CHOLHDL 4 10/10/2021   Lab Results  Component Value Date   ALT 47 10/10/2021   AST 26 10/10/2021   GGT 65 11/29/2020   ALKPHOS 68 10/10/2021   BILITOT 0.5 10/10/2021   Hypertension: Amlodipine, hctz, losartan all QD.  No new side effects. home readings:120-130/80.  BP Readings from Last 3 Encounters:  11/25/21 124/66  08/28/21 130/70  05/27/21 118/72   Lab Results  Component Value Date   CREATININE 0.72 10/10/2021   HM: Flu vaccine at work.      History Patient Active Problem List   Diagnosis Date Noted   Pre-diabetes 01/23/2021   Past Medical History:  Diagnosis Date   Diabetes (HCC)    Hyperlipidemia    Hypertension    Pre-diabetes    Tuberculosis    Past Surgical History:  Procedure Laterality Date    THORACENTESIS  2000   No Known Allergies Prior to Admission medications   Medication Sig Start Date End Date Taking? Authorizing Provider  amLODipine (NORVASC) 10 MG tablet Take 1 tablet (10 mg total) by mouth daily. 08/28/21  Yes Shade Flood, MD  atorvastatin (LIPITOR) 10 MG tablet Take 1 tablet (10 mg total) by mouth 2 (two) times a week. 11/25/21  Yes Shade Flood, MD  hydrochlorothiazide (HYDRODIURIL) 12.5 MG tablet Take 1 tablet (12.5 mg total) by mouth daily. 08/28/21  Yes Shade Flood, MD  losartan (COZAAR) 25 MG tablet Take 1 tablet (25 mg total) by mouth every morning. 10/28/21 04/26/22 Yes Tolia, Sunit, DO  Multiple Vitamin (MULTIVITAMIN) tablet Take 1 tablet by mouth daily.   Yes [provider]   Social History   Socioeconomic History   Marital status: Married    Spouse name: Not on file   Number of children: 1   Years of education: Not on file   Highest education level: Not on file  Occupational History   Not on file  Tobacco Use   Smoking status: Never   Smokeless tobacco: Never  Vaping Use   Vaping Use: Never used  Substance and Sexual Activity   Alcohol use:  Yes    Alcohol/week: 6.0 standard drinks of alcohol    Types: 6 Standard drinks or equivalent per week    Comment: occ   Drug use: No   Sexual activity: Not on file  Other Topics Concern   Not on file  Social History Narrative   Not on file   Social Determinants of Health   Financial Resource Strain: Not on file  Food Insecurity: Not on file  Transportation Needs: Not on file  Physical Activity: Not on file  Stress: Not on file  Social Connections: Not on file  Intimate Partner Violence: Not on file    Review of Systems  Constitutional:  Negative for fatigue and unexpected weight change.  Eyes:  Negative for visual disturbance.  Respiratory:  Negative for cough, chest tightness and shortness of breath.   Cardiovascular:  Negative for chest pain, palpitations and leg swelling.   Gastrointestinal:  Negative for abdominal pain and blood in stool.  Neurological:  Negative for dizziness, light-headedness and headaches.     Objective:   Vitals:   04/07/22 1545  Pulse: 87  Resp: 18  Temp: 98.2 F (36.8 C)  TempSrc: Temporal  SpO2: 99%  Weight: 246 lb (111.6 kg)  Height: 5\' 7"  (1.702 m)     Physical Exam Vitals reviewed.  Constitutional:      Appearance: He is well-developed.  HENT:     Head: Normocephalic and atraumatic.  Neck:     Vascular: No carotid bruit or JVD.  Cardiovascular:     Rate and Rhythm: Normal rate and regular rhythm.     Heart sounds: Normal heart sounds. No murmur heard. Pulmonary:     Effort: Pulmonary effort is normal.     Breath sounds: Normal breath sounds. No rales.  Musculoskeletal:     Right lower leg: No edema.     Left lower leg: No edema.  Skin:    General: Skin is warm and dry.  Neurological:     Mental Status: He is alert and oriented to person, place, and time.  Psychiatric:        Mood and Affect: Mood normal.        Assessment & Plan:  Adrian Alvarez is a 46 y.o. male . Elevated LDL cholesterol level - Plan: Comprehensive metabolic panel, Hemoglobin A1c, atorvastatin (LIPITOR) 10 MG tablet  Prediabetes  Essential hypertension - Plan: Hemoglobin A1c, amLODipine (NORVASC) 10 MG tablet, hydrochlorothiazide (HYDRODIURIL) 12.5 MG tablet  Hyperlipidemia, unspecified hyperlipidemia type - Plan: Lipid panel  Type 2 diabetes mellitus with obesity (HCC) - Plan: atorvastatin (LIPITOR) 10 MG tablet  Benign hypertension - Plan: losartan (COZAAR) 25 MG tablet   Meds ordered this encounter  Medications   atorvastatin (LIPITOR) 10 MG tablet    Sig: Take 1 tablet (10 mg total) by mouth 2 (two) times a week.    Dispense:  30 tablet    Refill:  1   amLODipine (NORVASC) 10 MG tablet    Sig: Take 1 tablet (10 mg total) by mouth daily.    Dispense:  90 tablet    Refill:  1   hydrochlorothiazide (HYDRODIURIL)  12.5 MG tablet    Sig: Take 1 tablet (12.5 mg total) by mouth daily.    Dispense:  90 tablet    Refill:  1   losartan (COZAAR) 25 MG tablet    Sig: Take 1 tablet (25 mg total) by mouth every morning.    Dispense:  90 tablet    Refill:  1   There are no Patient Instructions on file for this visit.    Signed,   Merri Ray, MD [place] 04/07/22 4:47 PM

## 2022-04-08 ENCOUNTER — Encounter: Payer: Self-pay | Admitting: Family Medicine

## 2022-04-08 LAB — COMPREHENSIVE METABOLIC PANEL
ALT: 65 U/L — ABNORMAL HIGH (ref 0–53)
AST: 30 U/L (ref 0–37)
Albumin: 4.3 g/dL (ref 3.5–5.2)
Alkaline Phosphatase: 82 U/L (ref 39–117)
BUN: 15 mg/dL (ref 6–23)
CO2: 25 mEq/L (ref 19–32)
Calcium: 9.5 mg/dL (ref 8.4–10.5)
Chloride: 103 mEq/L (ref 96–112)
Creatinine, Ser: 0.92 mg/dL (ref 0.40–1.50)
GFR: 98.74 mL/min (ref >60.00)
Glucose, Bld: 82 mg/dL (ref 70–99)
Potassium: 3.9 mEq/L (ref 3.5–5.1)
Sodium: 136 mEq/L (ref 135–145)
Total Bilirubin: 0.4 mg/dL (ref 0.2–1.2)
Total Protein: 7.6 g/dL (ref 6.0–8.3)

## 2022-04-08 LAB — LIPID PANEL
Cholesterol: 163 mg/dL (ref 0–200)
HDL: 48.6 mg/dL (ref >39.00)
LDL Cholesterol: 93 mg/dL (ref 0–99)
NonHDL: 114.2
Total CHOL/HDL Ratio: 3
Triglycerides: 108 mg/dL (ref 0.0–149.0)
VLDL: 21.6 mg/dL (ref 0.0–40.0)

## 2022-04-08 LAB — HEMOGLOBIN A1C: Hgb A1c MFr Bld: 6.6 % — ABNORMAL HIGH (ref 4.6–6.5)

## 2022-04-16 ENCOUNTER — Encounter: Payer: Self-pay | Admitting: Registered Nurse

## 2022-04-16 ENCOUNTER — Telehealth: Payer: Self-pay | Admitting: Registered Nurse

## 2022-04-16 DIAGNOSIS — I10 Essential (primary) hypertension: Secondary | ICD-10-CM | POA: Insufficient documentation

## 2022-04-16 NOTE — Telephone Encounter (Signed)
Dispensed 90 day supply amlodipine 10mg  po daily and hydrochlorothiazide 12.5mg  po daily to patient today from [facility] [place].  Patient had new Rx from [facility] in epic from 04/07/22 office visit.

## 2022-05-02 ENCOUNTER — Ambulatory Visit: Payer: No Typology Code available for payment source | Admitting: Cardiology

## 2022-05-13 ENCOUNTER — Ambulatory Visit: Payer: No Typology Code available for payment source | Admitting: Cardiology

## 2022-05-27 ENCOUNTER — Ambulatory Visit: Payer: No Typology Code available for payment source | Admitting: Cardiology

## 2022-05-27 ENCOUNTER — Encounter: Payer: Self-pay | Admitting: Cardiology

## 2022-05-27 VITALS — BP 130/84 | HR 80 | Temp 98.4°F | Resp 17 | Ht 67.0 in | Wt 246.0 lb

## 2022-05-27 DIAGNOSIS — E6609 Other obesity due to excess calories: Secondary | ICD-10-CM

## 2022-05-27 DIAGNOSIS — E7800 Pure hypercholesterolemia, unspecified: Secondary | ICD-10-CM

## 2022-05-27 DIAGNOSIS — E1165 Type 2 diabetes mellitus with hyperglycemia: Secondary | ICD-10-CM

## 2022-05-27 DIAGNOSIS — I10 Essential (primary) hypertension: Secondary | ICD-10-CM

## 2022-05-27 NOTE — Progress Notes (Signed)
Adrian Alvarez Date of Birth: Oct 21, 1975 MRN: [id number] Primary Care Provider:Greene, Ranell Patrick, MD Primary Cardiologist: Rex Kras, DO, [facility] (established care 11/24/2019)  Date: 05/27/22 Last Office Visit: 04/26/2021  Chief Complaint  Patient presents with   Follow-up    1 year   Hypertension   Hyperlipidemia    HPI  Adrian Alvarez is a 46 y.o. male whose past medical history and cardiovascular risk factors are: Hypertension, hyperlipidemia, diabetes, obesity.   Initially referred to the practice for evaluation of chest pain and benign essential hypertension.  He did undergo appropriate ischemic work-up as outlined below and his symptoms have improved.  We focused on improving his lifestyle and addressing his cardiovascular risk factors.  His LDL level were 156 mg/dL in setting of type 2 diabetes recommended initiation of statin therapy.  He spoke to his provider and is now on Lipitor 10 mg twice a week.  Most recent lipid profile notes improvement in LDL but would recommend a goal LDL to be less than 70 mg/dL in the setting of diabetes.  Home blood pressures are very well controlled with SBP around 120-130 mmHg according to his daughter.  He is exercising regularly up to 60-90 minutes on a treadmill 3 times a week.  Denies any exertional chest pain or heart failure symptoms.  FUNCTIONAL STATUS: He tries to walk 60 -90 mins treadmill three times a week.   ALLERGIES: No Known Allergies  MEDICATION LIST PRIOR TO VISIT: Current Meds  Medication Sig   amLODipine (NORVASC) 10 MG tablet Take 1 tablet (10 mg total) by mouth daily.   hydrochlorothiazide (HYDRODIURIL) 12.5 MG tablet Take 1 tablet (12.5 mg total) by mouth daily.   losartan (COZAAR) 25 MG tablet Take 1 tablet (25 mg total) by mouth every morning.   [DISCONTINUED] atorvastatin (LIPITOR) 10 MG tablet Take 1 tablet (10 mg total) by mouth 2 (two) times a week.     PAST MEDICAL HISTORY: Past Medical History:   Diagnosis Date   Diabetes ([place])    Hyperlipidemia    Hypertension    Pre-diabetes    Tuberculosis     PAST SURGICAL HISTORY: Past Surgical History:  Procedure Laterality Date   THORACENTESIS  2000    FAMILY HISTORY: The patient family history includes Healthy in his brother, brother, sister, sister, sister, sister, and sister; Hypertension in his father; Thyroid disease in his mother and sister.  SOCIAL HISTORY:  The patient  reports that he has never smoked. He has never used smokeless tobacco. He reports current alcohol use of about 6.0 standard drinks of alcohol per week. He reports that he does not use drugs.  REVIEW OF SYSTEMS: Review of Systems  Constitutional: Negative for chills and fever.  HENT:  Negative for hoarse voice and nosebleeds.   Eyes:  Negative for discharge, double vision and pain.  Cardiovascular:  Negative for chest pain, claudication, dyspnea on exertion, leg swelling, near-syncope, orthopnea, palpitations, paroxysmal nocturnal dyspnea and syncope.  Respiratory:  Negative for hemoptysis and shortness of breath.   Musculoskeletal:  Negative for muscle cramps and myalgias.  Gastrointestinal:  Negative for abdominal pain, constipation, diarrhea, hematemesis, hematochezia, melena and nausea.  Neurological:  Negative for dizziness and light-headedness.    PHYSICAL EXAM:    05/27/2022   10:06 AM 04/07/2022    3:45 PM 11/25/2021    4:15 PM  Vitals with BMI  Height 5\' 7"  5\' 7"  5\' 7"   Weight 246 lbs 246 lbs 244 lbs 3 oz  BMI 38.52 38.52  27.06  Systolic 237  628  Diastolic 84  66  Pulse 80 87 91   Physical Exam  Constitutional: No distress.  Age appropriate, hemodynamically stable.   Neck: No JVD present.  Cardiovascular: Normal rate, regular rhythm, S1 normal, S2 normal, intact distal pulses and normal pulses. Exam reveals no gallop, no S3 and no S4.  No murmur heard. Pulmonary/Chest: Effort normal and breath sounds normal. No stridor. He has no  wheezes. He has no rales.  Abdominal: Soft. Bowel sounds are normal. He exhibits no distension. There is no abdominal tenderness.  Musculoskeletal:        General: No edema.     Cervical back: Neck supple.  Neurological: He is alert and oriented to person, place, and time. He has intact cranial nerves (2-12).  Skin: Skin is warm and moist.   CARDIAC DATABASE: EKG: 05/27/2022: NSR, 75bpm, without underlying ischemia or injury pattern.   Echocardiogram: 01/16/2020: LVEF 31%, normal diastolic filling pattern, mild TR, no pulmonary hypertension.  Stress Testing: Exercise Sestamibi Stress Test 01/16/2020:  Normal ECG stress. The patient exercised for 9 minutes and 59 seconds of a Bruce protocol, achieving approximately 11.78 METs.  Normal BP response.  Stress terminated due to THR and fatigue achieved.  Myocardial perfusion is normal.  Overall LV systolic function is normal without regional wall motion abnormalities.  Stress LV EF: 65%.  No previous exam available for comparison. Low risk.   Heart Catheterization: None  LABORATORY DATA:    Latest Ref Rng & Units 11/29/2020    9:05 AM 05/24/2019    3:17 PM 11/05/2018    9:10 AM  CBC  WBC 3.4 - 10.8 x10E3/uL 7.7  9.4  9.7   Hemoglobin 13.0 - 17.7 g/dL 15.2  15.7  15.4   Hematocrit 37.5 - 51.0 % 44.8  46.6  45.1   Platelets 150 - 450 x10E3/uL 314  301  300        Latest Ref Rng & Units 04/07/2022    3:58 PM 10/10/2021    3:16 PM 05/27/2021    9:27 AM  CMP  Glucose 70 - 99 mg/dL 82  101  101   BUN 6 - 23 mg/dL 15  15  16    Creatinine 0.40 - 1.50 mg/dL 0.92  0.72  0.79   Sodium 135 - 145 mEq/L 136  137  138   Potassium 3.5 - 5.1 mEq/L 3.9  4.1  4.3   Chloride 96 - 112 mEq/L 103  104  104   CO2 19 - 32 mEq/L 25  23  25    Calcium 8.4 - 10.5 mg/dL 9.5  9.7  9.7   Total Protein 6.0 - 8.3 g/dL 7.6  7.0  7.5   Total Bilirubin 0.2 - 1.2 mg/dL 0.4  0.5  0.6   Alkaline Phos 39 - 117 U/L 82  68  66   AST 0 - 37 U/L 30  26  26    ALT 0 -  53 U/L 65  47  48     Lipid Panel  Lab Results  Component Value Date   CHOL 163 04/07/2022   HDL 48.60 04/07/2022   LDLCALC 93 04/07/2022   TRIG 108.0 04/07/2022   CHOLHDL 3 04/07/2022   No components found for: "NTPROBNP" Lab Results  Component Value Date   TSH 1.850 11/29/2020   TSH 1.290 05/24/2019   TSH 1.830 11/05/2018    BMP Recent Labs    10/10/21 1516 04/07/22 1558  NA  137 136  K 4.1 3.9  CL 104 103  CO2 23 25  GLUCOSE 101* 82  BUN 15 15  CREATININE 0.72 0.92  CALCIUM 9.7 9.5   HEMOGLOBIN A1C Lab Results  Component Value Date   HGBA1C 6.6 (H) 04/07/2022   MPG 120 (H) 03/21/2015    IMPRESSION:    ICD-10-CM   1. Benign hypertension  I10 EKG 12-Lead    2. Pure hypercholesterolemia  E78.00     3. Type 2 diabetes mellitus with hyperglycemia, without long-term current use of insulin (HCC)  E11.65     4. Class 2 obesity due to excess calories without serious comorbidity with body mass index (BMI) of 38.0 to 38.9 in adult  E66.09    Z68.38        RECOMMENDATIONS: Adrian Alvarez is a 46 y.o. male whose past medical history and cardiac risk factors include: Hypertension, hyperlipidemia, diabetes, obesity.  Benign hypertension Office and home blood pressures are well-controlled. Medications reconciled. Re emphasized the importance of low-salt diet. Given his age and cardiovascular risk factors recommend a goal SBP of 130 mmHg  Pure hypercholesterolemia Prior LDL levels were as high as 156 mg/dL. Most recent lipid profile from September 2023 independently reviewed, LDL 93 mg/dL. Recommended goal LDL of <70 mg/dL if able to tolerate. Recommend increasing Lipitor from twice a week to daily.  Patient would like to discuss further with PCP.  Type 2 diabetes mellitus with hyperglycemia, without long-term current use of insulin (HCC) We emphasized the importance of glycemic control. Continue ARB, statin therapy  Class 2 obesity due to excess calories  without serious comorbidity with body mass index (BMI) of 38.0 to 38.9 in adult Body mass index is 38.53 kg/m. I reviewed with the patient the importance of diet, regular physical activity/exercise, weight loss.   Patient is educated on increasing physical activity gradually as tolerated.  With the goal of moderate intensity exercise for 30 minutes a day 5 days a week.  Patient is overall doing well over the last 1 year.  No additional cardiovascular testing warranted at this time.  Will see him back on an annual basis sooner if needed.  FINAL MEDICATION LIST END OF ENCOUNTER: No orders of the defined types were placed in this encounter.    Current Outpatient Medications:    amLODipine (NORVASC) 10 MG tablet, Take 1 tablet (10 mg total) by mouth daily., Disp: 90 tablet, Rfl: 1   hydrochlorothiazide (HYDRODIURIL) 12.5 MG tablet, Take 1 tablet (12.5 mg total) by mouth daily., Disp: 90 tablet, Rfl: 1   losartan (COZAAR) 25 MG tablet, Take 1 tablet (25 mg total) by mouth every morning., Disp: 90 tablet, Rfl: 1   atorvastatin (LIPITOR) 10 MG tablet, TAKE 1 TABLET BY MOUTH TWICE A WEEK, Disp: 30 tablet, Rfl: 0  Orders Placed This Encounter  Procedures   EKG 12-Lead   --Continue cardiac medications as reconciled in final medication list. --Return in about 1 year (around 05/28/2023) for BP, Annual follow up visit. Or sooner if needed. --Continue follow-up with your primary care physician regarding the management of your other chronic comorbid conditions.  Patient's questions and concerns were addressed to his satisfaction. He voices understanding of the instructions provided during this encounter.   This note was created using a voice recognition software as a result there may be grammatical errors inadvertently enclosed that do not reflect the nature of this encounter. Every attempt is made to correct such errors.  Adrian Alvarez Terri Skains, DO, [facility]  Pager:  [phone] Office: [phone]

## 2022-06-01 ENCOUNTER — Other Ambulatory Visit: Payer: Self-pay | Admitting: Family Medicine

## 2022-06-01 DIAGNOSIS — E1169 Type 2 diabetes mellitus with other specified complication: Secondary | ICD-10-CM

## 2022-06-01 DIAGNOSIS — E7800 Pure hypercholesterolemia, unspecified: Secondary | ICD-10-CM

## 2022-06-20 ENCOUNTER — Encounter: Payer: Self-pay | Admitting: Cardiology

## 2022-07-17 ENCOUNTER — Encounter: Payer: Self-pay | Admitting: Registered Nurse

## 2022-07-17 ENCOUNTER — Other Ambulatory Visit: Payer: Self-pay | Admitting: Occupational Medicine

## 2022-07-17 ENCOUNTER — Telehealth: Payer: Self-pay | Admitting: Registered Nurse

## 2022-07-17 DIAGNOSIS — R7303 Prediabetes: Secondary | ICD-10-CM

## 2022-07-17 DIAGNOSIS — E559 Vitamin D deficiency, unspecified: Secondary | ICD-10-CM

## 2022-07-17 DIAGNOSIS — I10 Essential (primary) hypertension: Secondary | ICD-10-CM

## 2022-07-17 MED ORDER — AMLODIPINE BESYLATE 10 MG PO TABS: 10.0000 mg | ORAL_TABLET | Freq: Every day | ORAL | 2 refills | Status: DC

## 2022-07-17 MED ORDER — HYDROCHLOROTHIAZIDE 12.5 MG PO TABS: 12.5000 mg | ORAL_TABLET | Freq: Every day | ORAL | 2 refills | Status: DC

## 2022-07-17 NOTE — Progress Notes (Signed)
Lab drawn from Left AC tolerated well no issues noted.   

## 2022-07-17 NOTE — Telephone Encounter (Signed)
Patient saw cardiology 05/27/22 BP 130/84 HR 80 BMI 38.53 stable continue on statin and amlodipine 10mg  /hydrochlorothiazide 12,5mg  and losartan 25mg  po daily; atorvastatin 10mg   po twice a week.  Last labs 04/07/22   Next due Sep 2024 exec panel.  A1c due now.  RN Kimrey notified to schedule.  Dispensed 90 day supply today amlodipine 10mg  and hydrochlorothiazide 12.5mg  po daily from [facility] to patient today.  Last fill 04/15/22  Latest Reference Range & Units 05/27/21 09:27 08/28/21 16:46 10/10/21 15:16 04/07/22 15:58  COMPREHENSIVE METABOLIC PANEL  Rpt !  Rpt ! Rpt !  Sodium 135 - 145 mEq/L 138  137 136  Potassium 3.5 - 5.1 mEq/L 4.3  4.1 3.9  Chloride 96 - 112 mEq/L 104  104 103  CO2 19 - 32 mEq/L 25  23 25   Glucose 70 - 99 mg/dL 13/07/22 (H)  10/26/21 (H) 82  BUN 6 - 23 mg/dL 16  15 15   Creatinine 0.40 - 1.50 mg/dL 10/12/21  04/09/22  Calcium 8.4 - 10.5 mg/dL 9.7  9.7 9.5  Alkaline Phosphatase 39 - 117 U/L 66  68 82  Albumin 3.5 - 5.2 g/dL 4.7  4.6 4.3  AST 0 - 37 U/L 26  26 30   ALT 0 - 53 U/L 48  47 65 (H)  Total Protein 6.0 - 8.3 g/dL 7.5  7.0 7.6  Total Bilirubin 0.2 - 1.2 mg/dL 0.6  0.5 0.4  GFR 094 mL/min 106.09  108.82 98.74  Total CHOL/HDL Ratio  4  4 3   Cholesterol 0 - 200 mg/dL 709  6.28  HDL Cholesterol >39.00 mg/dL 3.66  2.94  LDL (calc) 0 - 99 mg/dL >76.54 (H)  84 93  NonHDL  155.22  117.34 114.20  Triglycerides 0.0 - 149.0 mg/dL  650 (H) 354  VLDL 0.0 - 40.0 mg/dL 656  81.27 51.70  Hemoglobin A1C 4.6 - 6.5 % 6.6 (H) 6.4 !  6.6 (H)  !: Data is abnormal (H): Data is abnormally high Rpt: View report in Results Review for more information

## 2022-07-18 ENCOUNTER — Encounter: Payer: Self-pay | Admitting: Registered Nurse

## 2022-07-18 LAB — VITAMIN D 25 HYDROXY (VIT D DEFICIENCY, FRACTURES): Vit D, 25-Hydroxy: 19.6 ng/mL — ABNORMAL LOW (ref 30.0–100.0)

## 2022-07-18 LAB — HEMOGLOBIN A1C
Est. average glucose Bld gHb Est-mCnc: 143 mg/dL
Hgb A1c MFr Bld: 6.6 % — ABNORMAL HIGH (ref 4.8–5.6)

## 2022-07-18 MED ORDER — CHOLECALCIFEROL 1.25 MG (50000 UT) PO TABS: 1.0000 | ORAL_TABLET | ORAL | 0 refills | Status: AC

## 2022-07-18 NOTE — Progress Notes (Signed)
My chart message sent to patient  Adrian Alvarez,  Your vitamin D level is low I recommend starting supplement cholecalciferol 50,000 units once a week with meal x 12 weeks then repeat your vitamin D level with Hgba1c nonfasting again.  Hgba1c stable 6.6 avoid dehydration, continue activity 150 minutes per week and keep follow ups as scheduled with your PCM.  Please let me know if you have further questions.  Exitcare handout on vitamin D deficiency sent to my chart also.  Sincerely,  Albina Billet NP-C

## 2022-08-08 ENCOUNTER — Other Ambulatory Visit (HOSPITAL_BASED_OUTPATIENT_CLINIC_OR_DEPARTMENT_OTHER): Payer: Self-pay

## 2022-08-08 MED ORDER — COVID-19 MRNA 2023-2024 VACCINE (COMIRNATY) 0.3 ML INJECTION
0.3000 mL | Freq: Once | INTRAMUSCULAR | 0 refills | Status: AC
  Filled 2022-08-08: qty 0.3, 1d supply, fill #0

## 2022-08-21 NOTE — Progress Notes (Signed)
This encounter was created in error - please disregard.

## 2022-10-06 ENCOUNTER — Ambulatory Visit (INDEPENDENT_AMBULATORY_CARE_PROVIDER_SITE_OTHER): Payer: No Typology Code available for payment source | Admitting: Family Medicine

## 2022-10-06 ENCOUNTER — Encounter: Payer: Self-pay | Admitting: Family Medicine

## 2022-10-06 VITALS — BP 110/64 | HR 69 | Temp 98.3°F | Ht 67.0 in | Wt 219.0 lb

## 2022-10-06 DIAGNOSIS — E1169 Type 2 diabetes mellitus with other specified complication: Secondary | ICD-10-CM | POA: Diagnosis not present

## 2022-10-06 DIAGNOSIS — Z23 Encounter for immunization: Secondary | ICD-10-CM

## 2022-10-06 DIAGNOSIS — E559 Vitamin D deficiency, unspecified: Secondary | ICD-10-CM

## 2022-10-06 DIAGNOSIS — Z125 Encounter for screening for malignant neoplasm of prostate: Secondary | ICD-10-CM | POA: Diagnosis not present

## 2022-10-06 DIAGNOSIS — Z0000 Encounter for general adult medical examination without abnormal findings: Secondary | ICD-10-CM

## 2022-10-06 DIAGNOSIS — E669 Obesity, unspecified: Secondary | ICD-10-CM | POA: Diagnosis not present

## 2022-10-06 DIAGNOSIS — E7800 Pure hypercholesterolemia, unspecified: Secondary | ICD-10-CM

## 2022-10-06 DIAGNOSIS — I10 Essential (primary) hypertension: Secondary | ICD-10-CM

## 2022-10-06 DIAGNOSIS — Z1211 Encounter for screening for malignant neoplasm of colon: Secondary | ICD-10-CM

## 2022-10-06 MED ORDER — VITAMIN D (ERGOCALCIFEROL) 1.25 MG (50000 UNIT) PO CAPS: 50000.0000 [IU] | ORAL_CAPSULE | ORAL | 0 refills | Status: DC

## 2022-10-06 MED ORDER — AMLODIPINE BESYLATE 5 MG PO TABS: 5.0000 mg | ORAL_TABLET | Freq: Every day | ORAL | 1 refills | Status: DC

## 2022-10-06 MED ORDER — LOSARTAN POTASSIUM 25 MG PO TABS: 25.0000 mg | ORAL_TABLET | Freq: Every morning | ORAL | 1 refills | Status: DC

## 2022-10-06 NOTE — Patient Instructions (Addendum)
Congrats on the weight loss!  I think we can try a lower dose of amlodipine for now.  If blood pressure starts to increase let me know and we can return to the 10 mg dose.  No other medication changes for now.  I have referred you to gastroenterology to talk about colonoscopy and blood in the stool, as well as the eye specialist for diabetic eye screening.  I suspect your labs will work much better.  I will check some lab work today, but there are a few tests that need to be checked at work.  I did refill the vitamin D for now.  Keep follow-up with your care provider through work and follow-up with me in 6 months.  Take care!  Preventive Care 49-49 Years Old, Male Preventive care refers to lifestyle choices and visits with your health care provider that can promote health and wellness. Preventive care visits are also called wellness exams. What can I expect for my preventive care visit? Counseling During your preventive care visit, your health care provider may ask about your: Medical history, including: Past medical problems. Family medical history. Current health, including: Emotional well-being. Home life and relationship well-being. Sexual activity. Lifestyle, including: Alcohol, nicotine or tobacco, and drug use. Access to firearms. Diet, exercise, and sleep habits. Safety issues such as seatbelt and bike helmet use. Sunscreen use. Work and work Statistician. Physical exam Your health care provider will check your: Height and weight. These may be used to calculate your BMI (body mass index). BMI is a measurement that tells if you are at a healthy weight. Waist circumference. This measures the distance around your waistline. This measurement also tells if you are at a healthy weight and may help predict your risk of certain diseases, such as type 2 diabetes and high blood pressure. Heart rate and blood pressure. Body temperature. Skin for abnormal spots. What immunizations do I  need?  Vaccines are usually given at various ages, according to a schedule. Your health care provider will recommend vaccines for you based on your age, medical history, and lifestyle or other factors, such as travel or where you work. What tests do I need? Screening Your health care provider may recommend screening tests for certain conditions. This may include: Lipid and cholesterol levels. Diabetes screening. This is done by checking your blood sugar (glucose) after you have not eaten for a while (fasting). Hepatitis B test. Hepatitis C test. HIV (human immunodeficiency virus) test. STI (sexually transmitted infection) testing, if you are at risk. Lung cancer screening. Prostate cancer screening. Colorectal cancer screening. Talk with your health care provider about your test results, treatment options, and if necessary, the need for more tests. Follow these instructions at home: Eating and drinking  Eat a diet that includes fresh fruits and vegetables, whole grains, lean protein, and low-fat dairy products. Take vitamin and mineral supplements as recommended by your health care provider. Do not drink alcohol if your health care provider tells you not to drink. If you drink alcohol: Limit how much you have to 0-2 drinks a day. Know how much alcohol is in your drink. In the [place] one drink equals one 12 oz bottle of beer (355 mL), one 5 oz glass of wine (148 mL), or one 1 oz glass of hard liquor (44 mL). Lifestyle Brush your teeth every morning and night with fluoride toothpaste. Floss one time each day. Exercise for at least 30 minutes 5 or more days each week. Do not use any  products that contain nicotine or tobacco. These products include cigarettes, chewing tobacco, and vaping devices, such as e-cigarettes. If you need help quitting, ask your health care provider. Do not use drugs. If you are sexually active, practice safe sex. Use a condom or other form of protection to prevent  STIs. Take aspirin only as told by your health care provider. Make sure that you understand how much to take and what form to take. Work with your health care provider to find out whether it is safe and beneficial for you to take aspirin daily. Find healthy ways to manage stress, such as: Meditation, yoga, or listening to music. Journaling. Talking to a trusted person. Spending time with friends and family. Minimize exposure to UV radiation to reduce your risk of skin cancer. Safety Always wear your seat belt while driving or riding in a vehicle. Do not drive: If you have been drinking alcohol. Do not ride with someone who has been drinking. When you are tired or distracted. While texting. If you have been using any mind-altering substances or drugs. Wear a helmet and other protective equipment during sports activities. If you have firearms in your house, make sure you follow all gun safety procedures. What's next? Go to your health care provider once a year for an annual wellness visit. Ask your health care provider how often you should have your eyes and teeth checked. Stay up to date on all vaccines. This information is not intended to replace advice given to you by your health care provider. Make sure you discuss any questions you have with your health care provider. Document Revised: 01/02/2021 Document Reviewed: 01/02/2021 Elsevier Patient Education  [place].

## 2022-10-06 NOTE — Progress Notes (Signed)
Subjective:  Patient ID: Adrian Alvarez, male    DOB: 04/18/1976  Age: 47 y.o. MRN: [id number]  CC:  Chief Complaint  Patient presents with   Annual Exam    Fasting     HPI Adrian Alvarez presents for Annual Exam  Vitamin D deficiency 19.6 in December, started on 50,000 unit supplement by care provider at his employer, [employer]. Taking D3 once per week - ran out last week.  A1c and vit d labs pended at work.   Prediabetes/Diabetes: Labs with [employer] Fraction in December.  Overall A1c stable, barely at diabetic level.  Exercise, fluids discussed, follow-up with me as scheduled.  No current diabetes meds.  Recommended avoiding sodas, watching portions.  Weight has significantly improved since November with diet, exercise - daily exercising, low carb diet.  Lab Results  Component Value Date   HGBA1C 6.6 (H) 07/17/2022   Wt Readings from Last 3 Encounters:  10/06/22 219 lb (99.3 kg)  05/27/22 246 lb (111.6 kg)  04/07/22 246 lb (111.6 kg)   Hypertension: Amlodipine 10 mg daily, hydrochlorothiazide 12.5 mg daily, Losartan 25 mg daily, weight has improved. No new side effects.  Home readings:117/70.  BP Readings from Last 3 Encounters:  10/06/22 110/64  05/27/22 130/84  11/25/21 124/66   Lab Results  Component Value Date   CREATININE 0.92 04/07/2022   Hyperlipidemia: Lipitor 10 mg 2 times per week. No new myalgias or side effects.  Lab Results  Component Value Date   CHOL 163 04/07/2022   HDL 48.60 04/07/2022   LDLCALC 93 04/07/2022   TRIG 108.0 04/07/2022   CHOLHDL 3 04/07/2022   Lab Results  Component Value Date   ALT 65 (H) 04/07/2022   AST 30 04/07/2022   GGT 65 11/29/2020   ALKPHOS 82 04/07/2022   BILITOT 0.4 04/07/2022          10/06/2022    2:54 PM 04/07/2022    3:51 PM 11/25/2021    4:18 PM 08/28/2021    3:49 PM 05/27/2021    8:35 AM  Depression screen PHQ 2/9  Decreased Interest 0 0 0 0 0  Down, Depressed, Hopeless 0 0 0 0 0  PHQ - 2 Score 0 0  0 0 0  Altered sleeping 0 0  0   Tired, decreased energy 0 0  0   Change in appetite 0 0  0   Feeling bad or failure about yourself  0 0  0   Trouble concentrating 0 0  0   Moving slowly or fidgety/restless 0 0  0   Suicidal thoughts 0 0  0   PHQ-9 Score 0 0  0   Difficult doing work/chores Not difficult at all        Health Maintenance  Topic Date Due   Diabetic kidney evaluation - Urine ACR  Never done   COLONOSCOPY (Pts 45-75yrs Insurance coverage will need to be confirmed)  11/26/2022 (Originally 04/21/2019)   Diabetic kidney evaluation - eGFR measurement  04/08/2023   DTaP/Tdap/Td (2 - Td or Tdap) 10/05/2032   INFLUENZA VACCINE  Completed   COVID-19 Vaccine  Completed   Hepatitis C Screening  Completed   HIV Screening  Completed   HPV VACCINES  Aged Out  Colon cancer screening - no FH of colon CA or personal hx of polyps. Some blood in stool prior to change in diet. Once or twice past 2 months has noticed BRBPR.  Prostate: does not have family history of prostate  cancer The natural history of prostate cancer and ongoing controversy regarding screening and potential treatment outcomes of prostate cancer has been discussed with the patient. The meaning of a false positive PSA and a false negative PSA has been discussed. He indicates understanding of the limitations of this screening test and wishes to defer to work physical.  Lab Results  Component Value Date   PSA1 3.1 11/29/2020   PSA1 2.1 11/05/2018   PSA1 2.2 05/01/2017      Immunization History  Administered Date(s) Administered   COVID-19, mRNA, vaccine(Comirnaty)12 years and older 08/08/2022   Influenza,inj,Quad PF,6+ Mos 05/16/2016, 05/08/2017, 05/02/2019, 05/03/2020, 05/09/2021   Influenza-Unspecified 05/02/2020, 03/21/2022   PFIZER(Purple Top)SARS-COV-2 Vaccination 10/13/2019, 11/04/2019, 06/02/2020   Tdap 10/06/2022  Tdap: today.  Had flu and covid vaccines at work.   Vision Screening   Right eye Left eye  Both eyes  Without correction 20/20 20/30 20/20   With correction      No optho.   Dental: every 6 months.   Alcohol: once per month.   Tobacco: none  Exercise: 6 days per week - 1-1.5 hrs.    History Patient Active Problem List   Diagnosis Date Noted   Essential hypertension 04/16/2022   Pre-diabetes 01/23/2021   Past Medical History:  Diagnosis Date   Diabetes ([place])    Hyperlipidemia    Hypertension    Pre-diabetes    Tuberculosis    Past Surgical History:  Procedure Laterality Date   THORACENTESIS  2000   No Known Allergies Prior to Admission medications   Medication Sig Start Date End Date Taking? Authorizing Provider  amLODipine (NORVASC) 10 MG tablet Take 1 tablet (10 mg total) by mouth daily. 07/17/22 04/13/23 Yes Betancourt, Aura Fey, NP  atorvastatin (LIPITOR) 10 MG tablet TAKE 1 TABLET BY MOUTH TWICE A WEEK 06/02/22  Yes Wendie Agreste, MD  hydrochlorothiazide (HYDRODIURIL) 12.5 MG tablet Take 1 tablet (12.5 mg total) by mouth daily. 07/17/22 04/13/23 Yes Betancourt, Aura Fey, NP  losartan (COZAAR) 25 MG tablet Take 1 tablet (25 mg total) by mouth every morning. 04/07/22 10/06/22 Yes Wendie Agreste, MD   Social History   Socioeconomic History   Marital status: Married    Spouse name: Adrian Alvarez   Number of children: 1   Years of education: Not on file   Highest education level: Not on file  Occupational History   Not on file  Tobacco Use   Smoking status: Never   Smokeless tobacco: Never  Vaping Use   Vaping Use: Never used  Substance and Sexual Activity   Alcohol use: Yes    Alcohol/week: 6.0 standard drinks of alcohol    Types: 6 Standard drinks or equivalent per week    Comment: occ   Drug use: No   Sexual activity: Not on file  Other Topics Concern   Not on file  Social History Narrative   Not on file   Social Determinants of Health   Financial Resource Strain: Not on file  Food Insecurity: Not on file  Transportation Needs: Not on file   Physical Activity: Not on file  Stress: Not on file  Social Connections: Not on file  Intimate Partner Violence: Not on file    Review of Systems  13 point review of systems per patient health survey noted.  Negative other than as indicated above or in HPI.   Objective:   Vitals:   10/06/22 1449  BP: 110/64  Pulse: 69  Temp: 98.3 F (36.8 C)  TempSrc: Temporal  SpO2: 98%  Weight: 219 lb (99.3 kg)  Height: 5\' 7"  (1.702 m)     Physical Exam Vitals reviewed.  Constitutional:      Appearance: He is well-developed.  HENT:     Head: Normocephalic and atraumatic.     Right Ear: External ear normal.     Left Ear: External ear normal.  Eyes:     Conjunctiva/sclera: Conjunctivae normal.     Pupils: Pupils are equal, round, and reactive to light.  Neck:     Thyroid: No thyromegaly.  Cardiovascular:     Rate and Rhythm: Normal rate and regular rhythm.     Heart sounds: Normal heart sounds.  Pulmonary:     Effort: Pulmonary effort is normal. No respiratory distress.     Breath sounds: Normal breath sounds. No wheezing.  Abdominal:     General: There is no distension.     Palpations: Abdomen is soft.     Tenderness: There is no abdominal tenderness.  Musculoskeletal:        General: No tenderness. Normal range of motion.     Cervical back: Normal range of motion and neck supple.  Lymphadenopathy:     Cervical: No cervical adenopathy.  Skin:    General: Skin is warm and dry.  Neurological:     Mental Status: He is alert and oriented to person, place, and time.     Deep Tendon Reflexes: Reflexes are normal and symmetric.  Psychiatric:        Behavior: Behavior normal.        Assessment & Plan:  Zaiyan Zeman is a 47 y.o. male . Annual physical exam  - -anticipatory guidance as below in AVS, screening labs above. Health maintenance items as above in HPI discussed/recommended as applicable.   Elevated LDL cholesterol level - Plan: Comprehensive metabolic panel,  Lipid panel  -Tolerating statin, continue same dose, medication adjustments accordingly based on lab results.  Type 2 diabetes mellitus with obesity ([place]) - Plan: Microalbumin / creatinine urine ratio, Ambulatory referral to Ophthalmology  -Plan for updated A1c with provider work.  Anticipate significant improvement with weight loss, commended on his improvements.  Check urine microalbumin.  No new meds for now  Benign hypertension - Plan: losartan (COZAAR) 25 MG tablet, amLODipine (NORVASC) 5 MG tablet  -Improved control with weight loss, will try lower dose amlodipine.  Continue same dose losartan, HCTZ for now  Screen for colon cancer - Plan: Ambulatory referral to Gastroenterology  -Due for colonoscopy, intermittent bright red blood per rectum.  Refer to gastroenterology, asymptomatic currently.  Screening for malignant neoplasm of prostate - Plan: PSA  Vitamin D deficiency - Plan: Vitamin D, Ergocalciferol, (DRISDOL) 1.25 MG (50000 UNIT) CAPS capsule  -Plan for repeat vitamin D with his provider at work.  Refilled prescription strength.  Need for Tdap vaccination - Plan: Tdap vaccine greater than or equal to 7yo IM   Meds ordered this encounter  Medications   losartan (COZAAR) 25 MG tablet    Sig: Take 1 tablet (25 mg total) by mouth every morning.    Dispense:  90 tablet    Refill:  1   amLODipine (NORVASC) 5 MG tablet    Sig: Take 1 tablet (5 mg total) by mouth daily.    Dispense:  90 tablet    Refill:  1   Vitamin D, Ergocalciferol, (DRISDOL) 1.25 MG (50000 UNIT) CAPS capsule    Sig: Take 1 capsule (50,000 Units total) by mouth every  7 (seven) days.    Dispense:  15 capsule    Refill:  0   Patient Instructions  Congrats on the weight loss!  I think we can try a lower dose of amlodipine for now.  If blood pressure starts to increase let me know and we can return to the 10 mg dose.  No other medication changes for now.  I have referred you to gastroenterology to talk about  colonoscopy and blood in the stool, as well as the eye specialist for diabetic eye screening.  I suspect your labs will work much better.  I will check some lab work today, but there are a few tests that need to be checked at work.  I did refill the vitamin D for now.  Keep follow-up with your care provider through work and follow-up with me in 6 months.  Take care!  Preventive Care 20-32 Years Old, Male Preventive care refers to lifestyle choices and visits with your health care provider that can promote health and wellness. Preventive care visits are also called wellness exams. What can I expect for my preventive care visit? Counseling During your preventive care visit, your health care provider may ask about your: Medical history, including: Past medical problems. Family medical history. Current health, including: Emotional well-being. Home life and relationship well-being. Sexual activity. Lifestyle, including: Alcohol, nicotine or tobacco, and drug use. Access to firearms. Diet, exercise, and sleep habits. Safety issues such as seatbelt and bike helmet use. Sunscreen use. Work and work Statistician. Physical exam Your health care provider will check your: Height and weight. These may be used to calculate your BMI (body mass index). BMI is a measurement that tells if you are at a healthy weight. Waist circumference. This measures the distance around your waistline. This measurement also tells if you are at a healthy weight and may help predict your risk of certain diseases, such as type 2 diabetes and high blood pressure. Heart rate and blood pressure. Body temperature. Skin for abnormal spots. What immunizations do I need?  Vaccines are usually given at various ages, according to a schedule. Your health care provider will recommend vaccines for you based on your age, medical history, and lifestyle or other factors, such as travel or where you work. What tests do I  need? Screening Your health care provider may recommend screening tests for certain conditions. This may include: Lipid and cholesterol levels. Diabetes screening. This is done by checking your blood sugar (glucose) after you have not eaten for a while (fasting). Hepatitis B test. Hepatitis C test. HIV (human immunodeficiency virus) test. STI (sexually transmitted infection) testing, if you are at risk. Lung cancer screening. Prostate cancer screening. Colorectal cancer screening. Talk with your health care provider about your test results, treatment options, and if necessary, the need for more tests. Follow these instructions at home: Eating and drinking  Eat a diet that includes fresh fruits and vegetables, whole grains, lean protein, and low-fat dairy products. Take vitamin and mineral supplements as recommended by your health care provider. Do not drink alcohol if your health care provider tells you not to drink. If you drink alcohol: Limit how much you have to 0-2 drinks a day. Know how much alcohol is in your drink. In the [place] one drink equals one 12 oz bottle of beer (355 mL), one 5 oz glass of wine (148 mL), or one 1 oz glass of hard liquor (44 mL). Lifestyle Brush your teeth every morning and night with fluoride  toothpaste. Floss one time each day. Exercise for at least 30 minutes 5 or more days each week. Do not use any products that contain nicotine or tobacco. These products include cigarettes, chewing tobacco, and vaping devices, such as e-cigarettes. If you need help quitting, ask your health care provider. Do not use drugs. If you are sexually active, practice safe sex. Use a condom or other form of protection to prevent STIs. Take aspirin only as told by your health care provider. Make sure that you understand how much to take and what form to take. Work with your health care provider to find out whether it is safe and beneficial for you to take aspirin daily. Find  healthy ways to manage stress, such as: Meditation, yoga, or listening to music. Journaling. Talking to a trusted person. Spending time with friends and family. Minimize exposure to UV radiation to reduce your risk of skin cancer. Safety Always wear your seat belt while driving or riding in a vehicle. Do not drive: If you have been drinking alcohol. Do not ride with someone who has been drinking. When you are tired or distracted. While texting. If you have been using any mind-altering substances or drugs. Wear a helmet and other protective equipment during sports activities. If you have firearms in your house, make sure you follow all gun safety procedures. What's next? Go to your health care provider once a year for an annual wellness visit. Ask your health care provider how often you should have your eyes and teeth checked. Stay up to date on all vaccines. This information is not intended to replace advice given to you by your health care provider. Make sure you discuss any questions you have with your health care provider. Document Revised: 01/02/2021 Document Reviewed: 01/02/2021 Elsevier Patient Education  [place],   Merri Ray, MD [place], [facility] 10/06/22 5:56 PM

## 2022-10-07 LAB — COMPREHENSIVE METABOLIC PANEL
ALT: 53 U/L (ref 0–53)
AST: 32 U/L (ref 0–37)
Albumin: 4.7 g/dL (ref 3.5–5.2)
Alkaline Phosphatase: 69 U/L (ref 39–117)
BUN: 10 mg/dL (ref 6–23)
CO2: 24 mEq/L (ref 19–32)
Calcium: 10.2 mg/dL (ref 8.4–10.5)
Chloride: 101 mEq/L (ref 96–112)
Creatinine, Ser: 0.88 mg/dL (ref 0.40–1.50)
GFR: 101.71 mL/min (ref >60.00)
Glucose, Bld: 81 mg/dL (ref 70–99)
Potassium: 4.1 mEq/L (ref 3.5–5.1)
Sodium: 137 mEq/L (ref 135–145)
Total Bilirubin: 0.8 mg/dL (ref 0.2–1.2)
Total Protein: 7.8 g/dL (ref 6.0–8.3)

## 2022-10-07 LAB — LIPID PANEL
Cholesterol: 148 mg/dL (ref 0–200)
HDL: 48.7 mg/dL (ref >39.00)
LDL Cholesterol: 83 mg/dL (ref 0–99)
NonHDL: 99.54
Total CHOL/HDL Ratio: 3
Triglycerides: 85 mg/dL (ref 0.0–149.0)
VLDL: 17 mg/dL (ref 0.0–40.0)

## 2022-10-07 LAB — MICROALBUMIN / CREATININE URINE RATIO
Creatinine,U: 134.8 mg/dL
Microalb Creat Ratio: 0.5 mg/g (ref 0.0–30.0)
Microalb, Ur: <0.7 mg/dL (ref 0.0–1.9)

## 2022-10-07 LAB — PSA: PSA: 3.24 ng/mL (ref 0.10–4.00)

## 2022-10-09 ENCOUNTER — Encounter: Payer: Self-pay | Admitting: Registered Nurse

## 2022-10-09 ENCOUNTER — Telehealth: Payer: Self-pay | Admitting: Registered Nurse

## 2022-10-09 DIAGNOSIS — E559 Vitamin D deficiency, unspecified: Secondary | ICD-10-CM

## 2022-10-09 MED ORDER — CHOLECALCIFEROL 1.25 MG (50000 UT) PO TABS: 1.0000 | ORAL_TABLET | ORAL | 0 refills | Status: DC

## 2022-10-09 NOTE — Telephone Encounter (Signed)
Patient had follow up labs with PCM and amlodipine decreased to 5mg .  He will split his 10mg  tabs has about 30 left and then will fill 5mg  from [facility].  No vitamin D follow up lab with [facility]  Will recheck in 3 months refilled cholecalciferol 50,000 units po weekly and nonfasting with Hgba1c both last tested Dec 2023  RN Kimrey notified to schedule.  Patient notified Rx sent to pharmacy to continue for 12 weeks and then will stop for summer and restart next winter supplement again.  Patient agreed with plan of care and had no further questions at this time.

## 2022-10-13 ENCOUNTER — Other Ambulatory Visit: Payer: No Typology Code available for payment source | Admitting: Occupational Medicine

## 2022-10-13 DIAGNOSIS — R7303 Prediabetes: Secondary | ICD-10-CM

## 2022-10-13 DIAGNOSIS — E559 Vitamin D deficiency, unspecified: Secondary | ICD-10-CM

## 2022-10-13 NOTE — Progress Notes (Signed)
Lab drawn from Left AC tolerated well no issues noted.   

## 2022-10-14 ENCOUNTER — Ambulatory Visit: Payer: No Typology Code available for payment source | Admitting: Occupational Medicine

## 2022-10-14 ENCOUNTER — Other Ambulatory Visit: Payer: Self-pay | Admitting: Registered Nurse

## 2022-10-14 DIAGNOSIS — R7303 Prediabetes: Secondary | ICD-10-CM

## 2022-10-14 DIAGNOSIS — Z8639 Personal history of other endocrine, nutritional and metabolic disease: Secondary | ICD-10-CM

## 2022-10-14 DIAGNOSIS — E559 Vitamin D deficiency, unspecified: Secondary | ICD-10-CM

## 2022-10-14 LAB — HEMOGLOBIN A1C
Est. average glucose Bld gHb Est-mCnc: 120 mg/dL
Hgb A1c MFr Bld: 5.8 % — ABNORMAL HIGH (ref 4.8–5.6)

## 2022-10-14 LAB — VITAMIN D 25 HYDROXY (VIT D DEFICIENCY, FRACTURES): Vit D, 25-Hydroxy: 93 ng/mL (ref 30.0–100.0)

## 2022-10-14 MED ORDER — VITAMIN D3 50 MCG (2000 UT) PO CAPS: 2000.0000 [IU] | ORAL_CAPSULE | Freq: Every day | ORAL | 11 refills | Status: AC

## 2022-10-14 NOTE — Progress Notes (Signed)
Your Hgba1c has improved but still slightly elevated.  Consider appt with dietitian. Emailed link to Surveyor, quantity.  Keep added sugars less than 35 grams/7 teaspoons/150 calories per day; fiber 30 grams per day and activity 150 minutes per week.  Avoid dehydration drink water to keep urine pale yellow clear and urinating every 2-4 hours while awake.  Consider weight loss.  Dietitian med[employer] free visits link to schedule [facility] ([email or web address])  Vitamin D normal but high NP recommend cutting down on your supplement to 2000 international units by mouth daily with meal now instead of high dose.  Please let us know if you have further questions or concerns.  Repeat vitamin D level in 1 year.  Repeat Hbga1c in 3 months along with weight Scheduled at the end of June. Patient has loss 25 lbs in the last 16 month.  Sincerely,

## 2022-10-14 NOTE — Progress Notes (Signed)
My chart message sent to patient Adrian Alvarez, Your Hgba1c has improved but still slightly elevated.  Consider appt with dietitian.  Keep added sugars less than 35 grams/7 teaspoons/150 calories per day; fiber 30 grams per day and activity 150 minutes per week.  Avoid dehydration drink water to keep urine pale yellow clear and urinating every 2-4 hours while awake.  Consider weight loss.  Dietitian medcost free visits link to schedule [facility] ([email or web address])  Vitamin D normal but high I recommend cutting down on your supplement to 2000 international units by mouth daily with meal now instead of high dose.  Please let us know if you have further questions or concerns.  Repeat vitamin D level in 1 year.  Repeat Hbga1c in 3 months along with weight with RN Evlyn Kanner. Sincerely, Gerarda Fraction NP-C

## 2022-10-16 ENCOUNTER — Encounter: Payer: Self-pay | Admitting: *Deleted

## 2022-10-21 ENCOUNTER — Telehealth: Payer: Self-pay | Admitting: Registered Nurse

## 2022-10-21 DIAGNOSIS — Z0000 Encounter for general adult medical examination without abnormal findings: Secondary | ICD-10-CM

## 2022-10-21 DIAGNOSIS — I10 Essential (primary) hypertension: Secondary | ICD-10-CM

## 2022-10-21 MED ORDER — AMLODIPINE BESYLATE 10 MG PO TABS: 5.0000 mg | ORAL_TABLET | Freq: Every day | ORAL | 0 refills | Status: DC

## 2022-10-21 NOTE — Telephone Encounter (Signed)
PCM stated patient could decrease amlodipine to 5mg  now after 25 lb weight loss.  New order entered for patient as Rx required from [place] clinic provider to fill Rx onsite from formulary.  Patient continuing his weight loss efforts and feeling well at this time.  Next labs due July 2024.  Patient given 90 tabs each medication from [facility] today.  Patient verbalized understanding information/instructions, agreed with plan of care and had no further questions at this time.  Last labs stable  Latest Reference Range & Units 10/06/22 15:49  COMPREHENSIVE METABOLIC PANEL  Rpt  Sodium 135 - 145 mEq/L 137  Potassium 3.5 - 5.1 mEq/L 4.1  Chloride 96 - 112 mEq/L 101  CO2 19 - 32 mEq/L 24  Glucose 70 - 99 mg/dL 81  BUN 6 - 23 mg/dL 10  Creatinine 0.40 - 1.50 mg/dL 0.88  Calcium 8.4 - 10.5 mg/dL 10.2  Alkaline Phosphatase 39 - 117 U/L 69  Albumin 3.5 - 5.2 g/dL 4.7  AST 0 - 37 U/L 32  ALT 0 - 53 U/L 53  Total Protein 6.0 - 8.3 g/dL 7.8  Total Bilirubin 0.2 - 1.2 mg/dL 0.8  GFR >60.00 mL/min 101.71  Total CHOL/HDL Ratio  3  Cholesterol 0 - 200 mg/dL 148  HDL Cholesterol >39.00 mg/dL 48.70  LDL (calc) 0 - 99 mg/dL 83  MICROALB/CREAT RATIO 0.0 - 30.0 mg/g 0.5  NonHDL  99.54  Triglycerides 0.0 - 149.0 mg/dL 85.0  VLDL 0.0 - 40.0 mg/dL 17.0  Rpt: View report in Results Review for more information  Signed Be Well 2025 paperwork 10/02/22 met requirements.  BP 118/72 weight 244lbs; 2024 Be Well 136/90 and weight 237 lbs; PCM scale 10/06/22 weight 219 lbs 11/07 23 246 lbs and 04/07/22 246 lbs

## 2022-10-28 LAB — HM DIABETES EYE EXAM

## 2022-11-14 ENCOUNTER — Other Ambulatory Visit: Payer: Self-pay | Admitting: Family Medicine

## 2022-11-14 DIAGNOSIS — I10 Essential (primary) hypertension: Secondary | ICD-10-CM

## 2022-12-24 ENCOUNTER — Telehealth: Payer: Self-pay | Admitting: Registered Nurse

## 2022-12-24 ENCOUNTER — Encounter: Payer: Self-pay | Admitting: Registered Nurse

## 2022-12-24 DIAGNOSIS — Z0000 Encounter for general adult medical examination without abnormal findings: Secondary | ICD-10-CM

## 2022-12-24 NOTE — Telephone Encounter (Signed)
Epic reviewed had labs and PCM visit 10/06/22 110/64 BP weight 219# 5'7" met requirements please complete paperwork with patient needs CBC [id number]  Latest Reference Range & Units 07/17/22 14:45 10/06/22 15:49 10/13/22 08:45  COMPREHENSIVE METABOLIC PANEL   Rpt   Sodium 956 - 145 mEq/L  137   Potassium 3.5 - 5.1 mEq/L  4.1   Chloride 96 - 112 mEq/L  101   CO2 19 - 32 mEq/L  24   Glucose 70 - 99 mg/dL  81   BUN 6 - 23 mg/dL  10   Creatinine 2.13 - 1.50 mg/dL  0.86   Calcium 8.4 - 57.8 mg/dL  46.9   Alkaline Phosphatase 39 - 117 U/L  69   Albumin 3.5 - 5.2 g/dL  4.7   AST 0 - 37 U/L  32   ALT 0 - 53 U/L  53   Total Protein 6.0 - 8.3 g/dL  7.8   Total Bilirubin 0.2 - 1.2 mg/dL  0.8   GFR >62.95 mL/min  101.71   Total CHOL/HDL Ratio   3   Cholesterol 0 - 200 mg/dL  284   HDL Cholesterol >39.00 mg/dL  13.24   LDL (calc) 0 - 99 mg/dL  83   MICROALB/CREAT RATIO 0.0 - 30.0 mg/g  0.5   NonHDL   99.54   Triglycerides 0.0 - 149.0 mg/dL  40.1   VLDL 0.0 - 02.7 mg/dL  25.3   Vitamin D, [id number] 30.0 - 100.0 ng/mL 19.6 (L)  93.0  Hemoglobin A1C 4.8 - 5.6 % 6.6 (H)  5.8 (H)  Est. average glucose Bld gHb Est-mCnc mg/dL 259  563  (L): Data is abnormally low (H): Data is abnormally high Rpt: View report in Results Review for more information

## 2022-12-31 ENCOUNTER — Ambulatory Visit: Payer: No Typology Code available for payment source | Admitting: Occupational Medicine

## 2022-12-31 VITALS — Wt 193.0 lb

## 2022-12-31 DIAGNOSIS — Z0000 Encounter for general adult medical examination without abnormal findings: Secondary | ICD-10-CM

## 2022-12-31 NOTE — Progress Notes (Signed)
Be well insurance premium discount evaluation: MET  Patient completed PCM office visit. Epic reviewed by RN Kimrey transcribed labs and reviewed with the patient.  Tobacco attestation signed. [facility] ROI formed signed. Forms placed in the chart.   Patient given handouts for [facility] pharmacies and discount drugs list, MyChart, [facility], [facility], [place] counseling and [employer] counseling.  What to do for infectious illness protocol. Given handout for list of medications that can be filled at [facility]. Given Clinic hours and Clinic Email.  Submitted Patient for Weight loss award.

## 2023-01-02 NOTE — Telephone Encounter (Signed)
RN Bess Kinds submitting patient for employer weight loss bonus as Nov 2023 246lbs; Mar 2024 219lbs this week 193 lbs on clinic weigh in

## 2023-01-02 NOTE — Telephone Encounter (Signed)
Be Well 2025 paperwork completed provider portion 01/01/23

## 2023-01-04 NOTE — Progress Notes (Signed)
Noted patient has had follow up labs 

## 2023-01-14 ENCOUNTER — Other Ambulatory Visit: Payer: Self-pay | Admitting: Occupational Medicine

## 2023-01-14 DIAGNOSIS — R7303 Prediabetes: Secondary | ICD-10-CM

## 2023-01-14 NOTE — Progress Notes (Signed)
Lab drawn from left AC tolerated well no issues noted.

## 2023-01-15 LAB — HEMOGLOBIN A1C
Est. average glucose Bld gHb Est-mCnc: 111 mg/dL
Hgb A1c MFr Bld: 5.5 % (ref 4.8–5.6)

## 2023-01-16 NOTE — Progress Notes (Signed)
noted 

## 2023-01-27 ENCOUNTER — Encounter: Payer: Self-pay | Admitting: Registered Nurse

## 2023-01-27 ENCOUNTER — Telehealth: Payer: Self-pay | Admitting: Registered Nurse

## 2023-01-27 DIAGNOSIS — I10 Essential (primary) hypertension: Secondary | ICD-10-CM

## 2023-01-27 NOTE — Telephone Encounter (Signed)
Last filled 90 day supply 10/21/2022 .  Last labs  10/06/22 BP 110/64 stable dispensed 90 day supply to patient from [facility] today.  137 136 137 138 141 R 138 R 139 R   Potassium 3.5 - 5.1 mEq/L 4.1 3.9 4.1 4.3 4.5 R 4.2 R 4.2 R  Chloride 96 - 112 mEq/L 101 103 104 104 101 R 102 R 103 R  CO2 19 - 32 mEq/L 24 25 23 25 26  R  22 R  Glucose, Bld 70 - 99 mg/dL 81 82 161 High  096 High  112 High  R 101 High  R 87 R  BUN 6 - 23 mg/dL 10 15 15 16 12  R 11 R 12 R  Creatinine, Ser 0.40 - 1.50 mg/dL 0.45 4.09 8.11 9.14 7.82 R 0.87 R 0.88 R  Total Bilirubin 0.2 - 1.2 mg/dL 0.8 0.4 0.5 0.6  0.5 R   Alkaline Phosphatase 39 - 117 U/L 69 82 68 66  83 R   AST 0 - 37 U/L 32 30 26 26  25  R   ALT 0 - 53 U/L 53 65 High  47 48  44 R   Total Protein 6.0 - 8.3 g/dL 7.8 7.6 7.0 7.5  7.6 R   Albumin 3.5 - 5.2 g/dL 4.7 4.3 4.6 4.7  4.6 R   GFR >60.00 mL/min 101.71 98.74 CM 108.82 CM 106.09 CM     Comment: Calculated using the CKD-EPI Creatinine Equation (2021)  Calcium 8.4 - 10.5 mg/dL 95.6 9.5 9.7 9.7

## 2023-02-13 NOTE — Progress Notes (Signed)
Noted patient happy with his current Hgba1c results

## 2023-02-13 NOTE — Progress Notes (Signed)
Per epic patient read my chart message and had follow up labs with [facility] 10/09/22 Hgba1c 5.8 medications adjusted.  Vitamin D rechecked 10/12/22 normal  Hgba1c rechecked again 5.5 now normal Be Well paperwork signed 01/01/23 and RN Kimrey notified HR patient met requirements for 2025 insurance discount BP 110/64 LDL 83 weight 193lbs started at 246lbs Nov 2023  weight loss was intentional  March weight 219lbs  Patient was submitted for weight loss bonus from employer also  RN Kimrey notified HR this is a one time bonus cannot be repeated if weight gain in future and loss again

## 2023-02-25 ENCOUNTER — Other Ambulatory Visit: Payer: Self-pay | Admitting: Family Medicine

## 2023-02-25 DIAGNOSIS — I10 Essential (primary) hypertension: Secondary | ICD-10-CM

## 2023-02-25 MED ORDER — LOSARTAN POTASSIUM 25 MG PO TABS: 25.0000 mg | ORAL_TABLET | Freq: Every morning | ORAL | 0 refills | Status: DC

## 2023-04-30 ENCOUNTER — Ambulatory Visit: Payer: No Typology Code available for payment source | Admitting: Family Medicine

## 2023-05-14 ENCOUNTER — Telehealth: Payer: Self-pay | Admitting: Registered Nurse

## 2023-05-14 ENCOUNTER — Ambulatory Visit: Payer: Self-pay

## 2023-05-14 ENCOUNTER — Encounter: Payer: Self-pay | Admitting: Registered Nurse

## 2023-05-14 VITALS — BP 129/92 | HR 79

## 2023-05-14 DIAGNOSIS — E7800 Pure hypercholesterolemia, unspecified: Secondary | ICD-10-CM

## 2023-05-14 DIAGNOSIS — E1169 Type 2 diabetes mellitus with other specified complication: Secondary | ICD-10-CM

## 2023-05-14 DIAGNOSIS — I10 Essential (primary) hypertension: Secondary | ICD-10-CM

## 2023-05-14 DIAGNOSIS — Z23 Encounter for immunization: Secondary | ICD-10-CM

## 2023-05-14 MED ORDER — ATORVASTATIN CALCIUM 10 MG PO TABS: ORAL_TABLET | ORAL | 0 refills | Status: DC

## 2023-05-14 MED ORDER — HYDROCHLOROTHIAZIDE 12.5 MG PO TABS: 12.5000 mg | ORAL_TABLET | Freq: Every day | ORAL | Status: DC

## 2023-05-14 NOTE — Telephone Encounter (Signed)
Patient requested refill amlodipine 5mg  po daily and hydrochlorothiazide 12.5mg  po daily.  Last dispensed 90 tabs each 10/21/22 amlodipine10mg  patient has been cutting in half and 01/27/23 hydrochlorothiazide 12.5mg  90 tabs.  BP today 129/92 HR 79 spo2 100% RA today Labs stable  Latest Reference Range & Units 10/06/22 15:49 10/13/22 08:45 01/14/23 09:05  COMPREHENSIVE METABOLIC PANEL  Rpt    Sodium 102 - 145 mEq/L 137    Potassium 3.5 - 5.1 mEq/L 4.1    Chloride 96 - 112 mEq/L 101    CO2 19 - 32 mEq/L 24    Glucose 70 - 99 mg/dL 81    BUN 6 - 23 mg/dL 10    Creatinine 7.25 - 1.50 mg/dL 3.66    Calcium 8.4 - 44.0 mg/dL 34.7    Alkaline Phosphatase 39 - 117 U/L 69    Albumin 3.5 - 5.2 g/dL 4.7    AST 0 - 37 U/L 32    ALT 0 - 53 U/L 53    Total Protein 6.0 - 8.3 g/dL 7.8    Total Bilirubin 0.2 - 1.2 mg/dL 0.8    GFR >42.59 mL/min 101.71    Total CHOL/HDL Ratio  3    Cholesterol 0 - 200 mg/dL 563    HDL Cholesterol >39.00 mg/dL 87.56    LDL (calc) 0 - 99 mg/dL 83    MICROALB/CREAT RATIO 0.0 - 30.0 mg/g 0.5    NonHDL  99.54    Triglycerides 0.0 - 149.0 mg/dL 43.3    VLDL 0.0 - 29.5 mg/dL 18.8    Vitamin D, 41-YSAYTKZ 30.0 - 100.0 ng/mL  93.0   Hemoglobin A1C 4.8 - 5.6 %  5.8 (H) 5.5  Est. average glucose Bld gHb Est-mCnc mg/dL  601 093  (H): Data is abnormally high Rpt: View report in Results Review for more information PCM stated patient could decrease amlodipine to 5mg  after annual appt spring after 25 lb weight loss. New order entered for patient as Rx required from [facility] provider to fill Rx onsite from formulary. Patient continuing his weight loss efforts and feeling well at this time. Patient given 90 tabs each medication from [facility] today. Accidental cancellation of atorvastatin 10mg  po take twice a week one tab reordered for patient.  Due follow up with [facility] Sep 2024 overdue.  Cardiology appt scheduled for November also cancelled per epic.  Will remind patient to  reschedule.  Patient verbalized understanding information/instructions, agreed with plan of care and had no further questions at this time.

## 2023-05-14 NOTE — Progress Notes (Signed)
BP check for Medication refill. Denies headache, SOB or dizziness at this time.

## 2023-05-29 ENCOUNTER — Ambulatory Visit: Payer: Self-pay | Admitting: Cardiology

## 2023-06-08 ENCOUNTER — Encounter: Payer: Self-pay | Admitting: Family Medicine

## 2023-06-08 ENCOUNTER — Ambulatory Visit: Payer: No Typology Code available for payment source | Admitting: Family Medicine

## 2023-06-08 VITALS — BP 128/70 | HR 74 | Temp 97.8°F | Ht 67.0 in | Wt 194.6 lb

## 2023-06-08 DIAGNOSIS — I10 Essential (primary) hypertension: Secondary | ICD-10-CM | POA: Diagnosis not present

## 2023-06-08 DIAGNOSIS — Z87898 Personal history of other specified conditions: Secondary | ICD-10-CM | POA: Diagnosis not present

## 2023-06-08 DIAGNOSIS — Z1211 Encounter for screening for malignant neoplasm of colon: Secondary | ICD-10-CM

## 2023-06-08 DIAGNOSIS — E7800 Pure hypercholesterolemia, unspecified: Secondary | ICD-10-CM | POA: Diagnosis not present

## 2023-06-08 DIAGNOSIS — E559 Vitamin D deficiency, unspecified: Secondary | ICD-10-CM

## 2023-06-08 MED ORDER — ATORVASTATIN CALCIUM 10 MG PO TABS
ORAL_TABLET | ORAL | 1 refills | Status: DC
Start: 2023-06-08 — End: 2023-08-20

## 2023-06-08 MED ORDER — LOSARTAN POTASSIUM 25 MG PO TABS: 25.0000 mg | ORAL_TABLET | Freq: Every morning | ORAL | 1 refills | Status: DC

## 2023-06-08 MED ORDER — AMLODIPINE BESYLATE 5 MG PO TABS: 5.0000 mg | ORAL_TABLET | Freq: Every day | ORAL | 1 refills | Status: DC

## 2023-06-08 NOTE — Progress Notes (Signed)
Subjective:  Patient ID: Adrian Alvarez, male    DOB: Jul 20, 1976  Age: 47 y.o. MRN: [id number]  CC:  Chief Complaint  Patient presents with   Medical Management of Chronic Issues    Pt doing well notes no questions     HPI Adrian Alvarez presents for   Hypertension: Treated with amlodipine 5 mg daily, hydrochlorothiazide 12.5 mg daily, losartan 25 mg daily. Home readings: 120 range systolic.  BP Readings from Last 3 Encounters:  06/08/23 128/70  05/14/23 (!) 129/92  10/06/22 110/64   Lab Results  Component Value Date   CREATININE 0.88 10/06/2022   Vitamin D deficiency Treated with prescription vitamin D 50,000 units/week, elevated reading in March, recommended by nurse at work to decrease supplement to 2000 units daily instead of the higher dose. Still on 2000iu vit D - takes intermittently. No recent use - off for a few months.  Last vitamin D Lab Results  Component Value Date   VD25OH 93.0 10/13/2022    Hyperlipidemia: Treated with intermittent Lipitor dosing, 10 mg twice per week at his last visit in March. Still 2 times per week, no new myalgias/side effects.  Lab Results  Component Value Date   CHOL 148 10/06/2022   HDL 48.70 10/06/2022   LDLCALC 83 10/06/2022   TRIG 85.0 10/06/2022   CHOLHDL 3 10/06/2022   Lab Results  Component Value Date   ALT 53 10/06/2022   AST 32 10/06/2022   GGT 65 11/29/2020   ALKPHOS 69 10/06/2022   BILITOT 0.8 10/06/2022    Prediabetes: History of diabetes/prediabetes with significant improvement with weight loss.  Weight up to 246 pounds in November 2023, down to 219 in March, 194 today.  Continues with daily exercise and low-carb diet.  Out of prediabetic range from March to June.  Previously up to 6.6 in 2023.  Lab Results  Component Value Date   HGBA1C 5.5 01/14/2023   Wt Readings from Last 3 Encounters:  06/08/23 194 lb 9.6 oz (88.3 kg)  01/14/23 191 lb 12.8 oz (87 kg)  12/31/22 193 lb (87.5 kg)    HM: Screening  options with colonoscopy versus Cologuard discussed. No FH of colon CA, no polyps or bleeding. Discussed timing of repeat testing intervals if normal, as well as potential need for diagnostic Colonoscopy if positive Cologuard. Understanding expressed, and chose Colonoscopy - sometime in February.    History Patient Active Problem List   Diagnosis Date Noted   Essential hypertension 04/16/2022   Pre-diabetes 01/23/2021   Past Medical History:  Diagnosis Date   Diabetes (HCC)    Hyperlipidemia    Hypertension    Pre-diabetes    Tuberculosis    Past Surgical History:  Procedure Laterality Date   THORACENTESIS  2000   No Known Allergies Prior to Admission medications   Medication Sig Start Date End Date Taking? Authorizing Provider  amLODipine (NORVASC) 5 MG tablet Take 1 tablet (5 mg total) by mouth daily. 10/06/22  Yes Shade Flood, MD  atorvastatin (LIPITOR) 10 MG tablet Take 1 tablet by mouth twice a week 05/14/23  Yes Betancourt, Jarold Song, NP  Cholecalciferol (VITAMIN D3) 50 MCG (2000 UT) CAPS Take 1 capsule (2,000 Units total) by mouth daily. 10/14/22 10/14/23 Yes Betancourt, Jarold Song, NP  hydrochlorothiazide (HYDRODIURIL) 12.5 MG tablet Take 1 tablet (12.5 mg total) by mouth daily. 05/14/23 02/08/24 Yes Betancourt, Jarold Song, NP  losartan (COZAAR) 25 MG tablet Take 1 tablet (25 mg total) by mouth every morning. 02/25/23  Yes Shade Flood, MD  Vitamin D, Ergocalciferol, (DRISDOL) 1.25 MG (50000 UNIT) CAPS capsule Take 1 capsule (50,000 Units total) by mouth every 7 (seven) days. 10/06/22  Yes Shade Flood, MD   Social History   Socioeconomic History   Marital status: Married    Spouse name: Adrian Alvarez   Number of children: 1   Years of education: Not on file   Highest education level: Not on file  Occupational History   Not on file  Tobacco Use   Smoking status: Never   Smokeless tobacco: Never  Vaping Use   Vaping status: Never Used  Substance and Sexual Activity    Alcohol use: Yes    Alcohol/week: 6.0 standard drinks of alcohol    Types: 6 Standard drinks or equivalent per week    Comment: occ   Drug use: No   Sexual activity: Not on file  Other Topics Concern   Not on file  Social History Narrative   Not on file   Social Determinants of Health   Financial Resource Strain: Not on file  Food Insecurity: Not on file  Transportation Needs: Not on file  Physical Activity: Not on file  Stress: Not on file  Social Connections: Not on file  Intimate Partner Violence: Not on file    Review of Systems  Constitutional:  Negative for fatigue and unexpected weight change.  Eyes:  Negative for visual disturbance.  Respiratory:  Negative for cough, chest tightness and shortness of breath.   Cardiovascular:  Negative for chest pain, palpitations and leg swelling.  Gastrointestinal:  Negative for abdominal pain and blood in stool.  Neurological:  Negative for dizziness, light-headedness and headaches.     Objective:   Vitals:   06/08/23 1323  BP: 128/70  Pulse: 74  Temp: 97.8 F (36.6 C)  TempSrc: Temporal  SpO2: 99%  Weight: 194 lb 9.6 oz (88.3 kg)  Height: 5\' 7"  (1.702 m)     Physical Exam Vitals reviewed.  Constitutional:      Appearance: He is well-developed.  HENT:     Head: Normocephalic and atraumatic.  Neck:     Vascular: No carotid bruit or JVD.  Cardiovascular:     Rate and Rhythm: Normal rate and regular rhythm.     Heart sounds: Normal heart sounds. No murmur heard. Pulmonary:     Effort: Pulmonary effort is normal.     Breath sounds: Normal breath sounds. No rales.  Musculoskeletal:     Right lower leg: No edema.     Left lower leg: No edema.  Skin:    General: Skin is warm and dry.  Neurological:     Mental Status: He is alert and oriented to person, place, and time.  Psychiatric:        Mood and Affect: Mood normal.        Assessment & Plan:  Adrian Alvarez is a 47 y.o. male . Benign hypertension -  Plan: losartan (COZAAR) 25 MG tablet, amLODipine (NORVASC) 5 MG tablet, Comprehensive metabolic panel, Lipid panel  --anticipatory guidance as below in AVS, screening labs above. Health maintenance items as above in HPI discussed/recommended as applicable.   Elevated LDL cholesterol level - Plan: atorvastatin (LIPITOR) 10 MG tablet, Lipid panel  -Check lipids, continue intermittent dosing Lipitor for now.  Adjust plan accordingly based on results  Screen for colon cancer - Plan: Ambulatory referral to Gastroenterology  Vitamin D deficiency  -Check vitamin D, adjust plan accordingly.  May need to  restart 2000 units supplement.  History of prediabetes - Plan: Hemoglobin A1c  -Commended on weight loss, exercise, most recent A1c in the normal range.  Updated labs ordered.  Continue diet/exercise approach.  Meds ordered this encounter  Medications   losartan (COZAAR) 25 MG tablet    Sig: Take 1 tablet (25 mg total) by mouth every morning.    Dispense:  90 tablet    Refill:  1   amLODipine (NORVASC) 5 MG tablet    Sig: Take 1 tablet (5 mg total) by mouth daily.    Dispense:  90 tablet    Refill:  1   atorvastatin (LIPITOR) 10 MG tablet    Sig: Take 1 tablet by mouth twice a week    Dispense:  30 tablet    Refill:  1   Patient Instructions  Great job on the weight loss. I will check labs, but if vitamin D is low, may need to restart your supplement.  No other med changes at this time. I will refer you for colonoscopy in February. Take care and keep up the good work!    Signed,   Meredith Staggers, MD [facility], [facility] 06/08/23 1:53 PM

## 2023-06-08 NOTE — Patient Instructions (Addendum)
Great job on the weight loss. I will check labs, but if vitamin D is low, may need to restart your supplement.  No other med changes at this time. I will refer you for colonoscopy in February. Take care and keep up the good work!

## 2023-06-09 LAB — HEMOGLOBIN A1C: Hgb A1c MFr Bld: 5.8 % (ref 4.6–6.5)

## 2023-06-09 LAB — COMPREHENSIVE METABOLIC PANEL
ALT: 27 U/L (ref 0–53)
AST: 28 U/L (ref 0–37)
Albumin: 4.9 g/dL (ref 3.5–5.2)
Alkaline Phosphatase: 47 U/L (ref 39–117)
BUN: 15 mg/dL (ref 6–23)
CO2: 26 meq/L (ref 19–32)
Calcium: 10.1 mg/dL (ref 8.4–10.5)
Chloride: 103 meq/L (ref 96–112)
Creatinine, Ser: 0.84 mg/dL (ref 0.40–1.50)
GFR: 102.67 mL/min (ref >60.00)
Glucose, Bld: 69 mg/dL — ABNORMAL LOW (ref 70–99)
Potassium: 4.4 meq/L (ref 3.5–5.1)
Sodium: 139 meq/L (ref 135–145)
Total Bilirubin: 0.7 mg/dL (ref 0.2–1.2)
Total Protein: 7.8 g/dL (ref 6.0–8.3)

## 2023-06-09 LAB — LIPID PANEL
Cholesterol: 158 mg/dL (ref 0–200)
HDL: 54.3 mg/dL (ref >39.00)
LDL Cholesterol: 94 mg/dL (ref 0–99)
NonHDL: 103.42
Total CHOL/HDL Ratio: 3
Triglycerides: 47 mg/dL (ref 0.0–149.0)
VLDL: 9.4 mg/dL (ref 0.0–40.0)

## 2023-06-09 LAB — VITAMIN D 25 HYDROXY (VIT D DEFICIENCY, FRACTURES): VITD: 50.55 ng/mL (ref 30.00–100.00)

## 2023-07-01 NOTE — Progress Notes (Unsigned)
Cardiology Office Note    Date:  07/02/2023  ID:  Adrian Alvarez, DOB 1976-03-20, MRN [id number] PCP:  Shade Flood, MD  Cardiologist:  Tessa Lerner, DO  Electrophysiologist:  None   Chief Complaint: Follow up for hypertension   History of Present Illness: .    Adrian Alvarez is a 47 y.o. male with visit-pertinent history of hypertension, hyperlipidemia, diabetes and obesity.  Patient is followed by Dr. Billy Coast.  He was initially referred to the practice for evaluation of chest pain and benign essential hypertension in 2021.  In 12/2019 he had a treadmill MPI that was considered normal and low risk.  Echocardiogram indicated LV normal in size and wall thickness, normal wall motion, LVEF 65% and normal diastolic filling pattern.  He was last seen by Dr. Odis Hollingshead on 05/27/2022.  He remained stable from a cardiac perspective  Today he presents for follow-up.  He reports that he is doing well.  He would like to discuss decreasing some of his hypertension medications.  He reports that he has lost 60 pounds in the last year through diet and exercise.  He reports that he regularly walks on a treadmill at home and does weightlifting.  He denies any chest pain, shortness of breath, lower extremity edema or palpitations.  Labwork independently reviewed: 06/08/2023: Sodium 139, potassium 4.4, creatinine 0.84, AST 28, ALT 27  ROS: .   Today he denies chest pain, shortness of breath, lower extremity edema, fatigue, palpitations, melena, hematuria, hemoptysis, diaphoresis, weakness, presyncope, syncope, orthopnea, and PND.  All other systems are reviewed and otherwise negative.  Studies Reviewed: Marland Kitchen    EKG:  EKG is ordered today, personally reviewed, demonstrating  EKG Interpretation Date/Time:  Thursday July 02 2023 14:13:23 EST Ventricular Rate:  75 PR Interval:  164 QRS Duration:  92 QT Interval:  370 QTC Calculation: 413 R Axis:   56  Text Interpretation: Normal sinus rhythm Normal ECG  Confirmed by Reather Littler [id number]) on 07/02/2023 2:20:40 PM   CV Studies:  Cardiac Studies & Procedures      ECHOCARDIOGRAM  PCV ECHOCARDIOGRAM COMPLETE 01/16/2020  Narrative Echocardiogram 01/16/2020: Left ventricle cavity is normal in size and wall thickness. Normal global wall motion. Normal LV systolic function with EF 65%. Normal diastolic filling pattern. Mild tricuspid regurgitation. No evidence of pulmonary hypertension.              Current Reported Medications:.    Current Meds  Medication Sig   atorvastatin (LIPITOR) 10 MG tablet Take 1 tablet by mouth twice a week   hydrochlorothiazide (HYDRODIURIL) 12.5 MG tablet Take 1 tablet (12.5 mg total) by mouth daily.   losartan (COZAAR) 25 MG tablet Take 1 tablet (25 mg total) by mouth every morning.   [DISCONTINUED] amLODipine (NORVASC) 5 MG tablet Take 1 tablet (5 mg total) by mouth daily.   Physical Exam:    VS:  BP 112/78   Pulse 75   Ht 5\' 7"  (1.702 m)   Wt 194 lb 3.2 oz (88.1 kg)   SpO2 97%   BMI 30.42 kg/m    Wt Readings from Last 3 Encounters:  07/02/23 194 lb 3.2 oz (88.1 kg)  06/08/23 194 lb 9.6 oz (88.3 kg)  01/14/23 191 lb 12.8 oz (87 kg)    GEN: Well nourished, well developed in no acute distress NECK: No JVD; No carotid bruits CARDIAC: RRR, no murmurs, rubs, gallops RESPIRATORY:  Clear to auscultation without rales, wheezing or rhonchi  ABDOMEN: Soft, non-tender, non-distended EXTREMITIES:  No edema; No acute deformity   Asessement and Plan:Marland Kitchen    Hypertension: Initial blood pressure today 96/70, on recheck 112/78. Reports home blood pressure consistently less than 120/80. Will stop amlodipine 5 mg daily.  Encouraged to monitor his blood pressures at home and if consistently elevated above 130/80 he will let the office know and we can resume his amlodipine. Continue hydrochlorothiazide 12.5 mg daily and losartan 25 mg daily.  Hyperlipidemia: Last lipid profile on 06/08/2023 indicated total cholesterol  158, triglycerides 47, HDL 54.3, LDL 94. Discussed with patient that his LDL goal is less than 70 given his history of diabetes.  Patient currently on Lipitor 10 mg twice a week. Patient declined increasing Lipitor. Heart healthy diet and regular cardiovascular exercise encouraged.    Type 2 diabetes mellitus: Last hemoglobin A1c 5.8 on 06/08/2023.  Monitored and managed per PCP.    Disposition: F/u with Dr. Odis Hollingshead or Reather Littler, NP in one year or sooner if needed.   Signed, Rip Harbour, NP

## 2023-07-02 ENCOUNTER — Ambulatory Visit: Payer: No Typology Code available for payment source | Attending: Cardiology | Admitting: Cardiology

## 2023-07-02 ENCOUNTER — Encounter: Payer: Self-pay | Admitting: Cardiology

## 2023-07-02 VITALS — BP 112/78 | HR 75 | Ht 67.0 in | Wt 194.2 lb

## 2023-07-02 DIAGNOSIS — I10 Essential (primary) hypertension: Secondary | ICD-10-CM

## 2023-07-02 DIAGNOSIS — E1165 Type 2 diabetes mellitus with hyperglycemia: Secondary | ICD-10-CM | POA: Diagnosis not present

## 2023-07-02 DIAGNOSIS — E7800 Pure hypercholesterolemia, unspecified: Secondary | ICD-10-CM

## 2023-07-02 NOTE — Patient Instructions (Signed)
Medication Instructions:  Stop Amlodipine *If you need a refill on your cardiac medications before your next appointment, please call your pharmacy*  Lab Work: No labs  Testing/Procedures: No testing  Follow-Up: At [facility], you and your health needs are our priority.  As part of our continuing mission to provide you with exceptional heart care, we have created designated Provider Care Teams.  These Care Teams include your primary Cardiologist (physician) and Advanced Practice Providers (APPs -  Physician Assistants and Nurse Practitioners) who all work together to provide you with the care you need, when you need it.  We recommend signing up for the patient portal called "MyChart".  Sign up information is provided on this After Visit Summary.  MyChart is used to connect with patients for Virtual Visits (Telemedicine).  Patients are able to view lab/test results, encounter notes, upcoming appointments, etc.  Non-urgent messages can be sent to your provider as well.   To learn more about what you can do with MyChart, go to [email or web address].    Your next appointment:   1 year(s)  Provider:   Tessa Lerner, DO  or Reather Littler, NP

## 2023-08-04 ENCOUNTER — Ambulatory Visit
Admission: EM | Admit: 2023-08-04 | Discharge: 2023-08-04 | Disposition: A | Discharge status: Home / Self Care | Payer: No Typology Code available for payment source | Attending: Emergency Medicine | Admitting: Emergency Medicine

## 2023-08-04 DIAGNOSIS — L509 Urticaria, unspecified: Secondary | ICD-10-CM | POA: Diagnosis not present

## 2023-08-04 MED ORDER — DEXAMETHASONE SODIUM PHOSPHATE 10 MG/ML IJ SOLN
10.0000 mg | Freq: Once | INTRAMUSCULAR | Status: AC
  Administered 2023-08-04: 10 mg via INTRAMUSCULAR

## 2023-08-04 MED ORDER — PREDNISONE 10 MG (21) PO TBPK: ORAL_TABLET | Freq: Every day | ORAL | 0 refills | Status: DC

## 2023-08-04 NOTE — Discharge Instructions (Signed)
 Evaluated for hives, you are considered to be allergic to Mucinex and this has been added to your record  You have been given an injection of steroids here today in the clinic and ideally will start to see improvement in your hives within the next hour  Starting tomorrow take prednisone  tablets every morning with food as directed  You may continue use of Zyrtec as needed to help with itching, you may also try topical Benadryl cream or calamine lotion  Avoid long exposure to heat as this will cause irritation to your skin  You may follow-up with his urgent care as needed

## 2023-08-04 NOTE — ED Provider Notes (Signed)
 Adrian Alvarez    CSN: [id number] Arrival date & time: 08/04/23  0834      History   Chief Complaint Chief Complaint  Patient presents with   Allergic Reaction   Urticaria    HPI Adrian Alvarez is a 48 y.o. male.   Patient presents for evaluation of generalized hives beginning 1 day ago after consumption of Mucinex.  Initially was feeling some chest tightness which has resolved has occurred before approximately 4 years prior.  Has attempted use of Zyrtec x 3 which has been ineffective.  Denies shortness of breath, wheezing, coughing, itchy or scratchy throat.  Past Medical History:  Diagnosis Date   Diabetes (HCC)    Hyperlipidemia    Hypertension    Pre-diabetes    Tuberculosis     Patient Active Problem List   Diagnosis Date Noted   Essential hypertension 04/16/2022   Pre-diabetes 01/23/2021    Past Surgical History:  Procedure Laterality Date   THORACENTESIS  2000       Home Medications    Prior to Admission medications   Medication Sig Start Date End Date Taking? Authorizing Provider  predniSONE  (STERAPRED UNI-PAK 21 TAB) 10 MG (21) TBPK tablet Take by mouth daily. Take 6 tabs by mouth daily  for 1 days, then 5 tabs for 1 days, then 4 tabs for 1 days, then 3 tabs for 1 days, 2 tabs for 1 days, then 1 tab by mouth daily for 1 days 08/04/23  Yes Teresa Shelba SAUNDERS, NP  atorvastatin  (LIPITOR) 10 MG tablet Take 1 tablet by mouth twice a week 06/08/23   Levora Reyes SAUNDERS, MD  Cholecalciferol  (VITAMIN D3) 50 MCG (2000 UT) CAPS Take 1 capsule (2,000 Units total) by mouth daily. Patient not taking: Reported on 07/02/2023 10/14/22 10/14/23  Betancourt, Tina A, NP  hydrochlorothiazide  (HYDRODIURIL ) 12.5 MG tablet Take 1 tablet (12.5 mg total) by mouth daily. 05/14/23 02/08/24  Betancourt, Ellouise LABOR, NP  losartan  (COZAAR ) 25 MG tablet Take 1 tablet (25 mg total) by mouth every morning. 06/08/23   Levora Reyes SAUNDERS, MD  Vitamin D , Ergocalciferol , (DRISDOL ) 1.25 MG (50000  UNIT) CAPS capsule Take 1 capsule (50,000 Units total) by mouth every 7 (seven) days. Patient not taking: Reported on 07/02/2023 10/06/22   Levora Reyes SAUNDERS, MD    Family History Family History  Problem Relation Age of Onset   Thyroid disease Mother    Hypertension Father    Thyroid disease Sister    Healthy Brother    Healthy Brother    Healthy Sister    Healthy Sister    Healthy Sister    Healthy Sister    Healthy Sister     Social History Social History   Tobacco Use   Smoking status: Never   Smokeless tobacco: Never  Vaping Use   Vaping status: Never Used  Substance Use Topics   Alcohol use: Yes    Alcohol/week: 6.0 standard drinks of alcohol    Types: 6 Standard drinks or equivalent per week    Comment: occ   Drug use: No     Allergies   Patient has no known allergies.   Review of Systems Review of Systems   Physical Exam Triage Vital Signs ED Triage Vitals  Encounter Vitals Group     BP 08/04/23 0846 135/87     Systolic BP Percentile --      Diastolic BP Percentile --      Pulse Rate 08/04/23 0846 93  Resp 08/04/23 0846 18     Temp 08/04/23 0846 97.7 F (36.5 C)     Temp Source 08/04/23 0846 Oral     SpO2 08/04/23 0846 94 %     Weight --      Height --      Head Circumference --      Peak Flow --      Pain Score 08/04/23 0843 0     Pain Loc --      Pain Education --      Exclude from Growth Chart --    No data found.  Updated Vital Signs BP 135/87 (BP Location: Left Arm)   Pulse 93   Temp 97.7 F (36.5 C) (Oral)   Resp 18   SpO2 94%   Visual Acuity Right Eye Distance:   Left Eye Distance:   Bilateral Distance:    Right Eye Near:   Left Eye Near:    Bilateral Near:     Physical Exam Constitutional:      Appearance: Normal appearance.  Eyes:     Extraocular Movements: Extraocular movements intact.  Pulmonary:     Effort: Pulmonary effort is normal.  Neurological:     Mental Status: He is alert and oriented to person,  place, and time. Mental status is at baseline.      UC Treatments / Results  Labs (all labs ordered are listed, but only abnormal results are displayed) Labs Reviewed - No data to display  EKG   Radiology No results found.  Procedures Procedures (including critical care time)  Medications Ordered in UC Medications  dexamethasone  (DECADRON ) injection 10 mg (has no administration in time range)    Initial Impression / Assessment and Plan / UC Course  I have reviewed the triage vital signs and the nursing notes.  Pertinent labs & imaging results that were available during my care of the patient were reviewed by me and considered in my medical decision making (see chart for details).  Hives   Generalized hives on presentation, no respiratory involvement, all vital signs are stable and patient is in no signs of distress nontoxic-appearing, Decadron  IM given and prescribed prednisone  for home use advised avoidance of long exposure to heat to prevent further irritation, recommended additional supportive care and advised follow-up if symptoms persist Final Clinical Impressions(s) / UC Diagnoses   Final diagnoses:  Hives     Discharge Instructions      Evaluated for hives, you are considered to be allergic to Mucinex and this has been added to your record  You have been given an injection of steroids here today in the clinic and ideally will start to see improvement in your hives within the next hour  Starting tomorrow take prednisone  tablets every morning with food as directed  You may continue use of Zyrtec as needed to help with itching, you may also try topical Benadryl cream or calamine lotion  Avoid long exposure to heat as this will cause irritation to your skin  You may follow-up with his urgent care as needed   ED Prescriptions     Medication Sig Dispense Auth. Provider   predniSONE  (STERAPRED UNI-PAK 21 TAB) 10 MG (21) TBPK tablet Take by mouth daily. Take 6  tabs by mouth daily  for 1 days, then 5 tabs for 1 days, then 4 tabs for 1 days, then 3 tabs for 1 days, 2 tabs for 1 days, then 1 tab by mouth daily for 1 days 21 tablet  Teresa Shelba SAUNDERS, NP      PDMP not reviewed this encounter.   Teresa Shelba SAUNDERS, [place] 08/04/23 [id number]

## 2023-08-04 NOTE — ED Triage Notes (Signed)
 Patient presents to UC for possible allergic reaction to mucinex. States he took a dose yesterday and after developed a rash. Reports this happened 4 years ago. Pt states he did have some swelling to throat area but resolved today. Took zyrtec yesterday.   Denies SOB.

## 2023-08-06 ENCOUNTER — Encounter: Payer: Self-pay | Admitting: Registered Nurse

## 2023-08-06 ENCOUNTER — Telehealth: Payer: Self-pay | Admitting: Registered Nurse

## 2023-08-06 DIAGNOSIS — R21 Rash and other nonspecific skin eruption: Secondary | ICD-10-CM

## 2023-08-06 NOTE — Telephone Encounter (Signed)
Notified by family member patient at home today due to rash/went to UC wanted to know if needs appt with me to RTW onsite.  Stated no if seen by another provider but I am in clinic Tues 9-12 and Thursday 11a-2p if patient wants to see me.  She verbalized understanding information and had no further questions at this time.

## 2023-08-15 ENCOUNTER — Other Ambulatory Visit: Payer: Self-pay | Admitting: Registered Nurse

## 2023-08-15 DIAGNOSIS — E7800 Pure hypercholesterolemia, unspecified: Secondary | ICD-10-CM

## 2023-08-25 ENCOUNTER — Telehealth: Payer: Self-pay | Admitting: Registered Nurse

## 2023-08-25 DIAGNOSIS — I10 Essential (primary) hypertension: Secondary | ICD-10-CM

## 2023-08-25 MED ORDER — HYDROCHLOROTHIAZIDE 12.5 MG PO TABS: 12.5000 mg | ORAL_TABLET | Freq: Every day | ORAL | Status: DC

## 2023-08-28 ENCOUNTER — Encounter: Payer: Self-pay | Admitting: Registered Nurse

## 2023-08-28 NOTE — Telephone Encounter (Signed)
 Patient seen in [facility]center 08/25/23 denied concerns or questions A&Ox3 skin warm dry and pink gait sure and steady respirations even and unlabored RA

## 2023-08-28 NOTE — Telephone Encounter (Signed)
 Patient requested refill hydrochlorothiazide  12.5mg  po daily.  Last dispensed 90 tabs 05/14/23.  Labs stable   Latest Reference Range & Units 10/06/22 15:49 10/13/22 08:45 01/14/23 09:05  COMPREHENSIVE METABOLIC PANEL   Rpt      Sodium 135 - 145 mEq/L 137      Potassium 3.5 - 5.1 mEq/L 4.1      Chloride 96 - 112 mEq/L 101      CO2 19 - 32 mEq/L 24      Glucose 70 - 99 mg/dL 81      BUN 6 - 23 mg/dL 10      Creatinine 9.59 - 1.50 mg/dL 9.11      Calcium  8.4 - 10.5 mg/dL 89.7      Alkaline Phosphatase 39 - 117 U/L 69      Albumin 3.5 - 5.2 g/dL 4.7      AST 0 - 37 U/L 32      ALT 0 - 53 U/L 53      Total Protein 6.0 - 8.3 g/dL 7.8      Total Bilirubin 0.2 - 1.2 mg/dL 0.8      GFR >39.99 mL/min 101.71      Total CHOL/HDL Ratio   3      Cholesterol 0 - 200 mg/dL 851      HDL Cholesterol >39.00 mg/dL 51.29      LDL (calc) 0 - 99 mg/dL 83      MICROALB/CREAT RATIO 0.0 - 30.0 mg/g 0.5      NonHDL   99.54      Triglycerides 0.0 - 149.0 mg/dL 14.9      VLDL 0.0 - 59.9 mg/dL 82.9      Vitamin D , 25-Hydroxy 30.0 - 100.0 ng/mL   93.0    Hemoglobin A1C 4.8 - 5.6 %   5.8 (H) 5.5  Est. average glucose Bld gHb Est-mCnc mg/dL   879 888  (H): Data is abnormally high Rpt: View report in Results Review for more informationCardiology discomntinued amlodipine  on 07/02/23 weight 194lbs BP 112/78 at cardiology EKG NSR.  Patient to continue lipitor 10mg  twice a week and daily hydrochlorothiazide  12.5mg  and losartan  25mg  po  Patient verbalized understanding information/instructions, agreed with plan of care and had no further questions at this time.

## 2023-09-02 ENCOUNTER — Encounter: Payer: Self-pay | Admitting: Internal Medicine

## 2023-09-07 ENCOUNTER — Ambulatory Visit (AMBULATORY_SURGERY_CENTER): Payer: No Typology Code available for payment source

## 2023-09-07 VITALS — Ht 67.0 in | Wt 196.0 lb

## 2023-09-07 DIAGNOSIS — Z1211 Encounter for screening for malignant neoplasm of colon: Secondary | ICD-10-CM

## 2023-09-07 MED ORDER — NA SULFATE-K SULFATE-MG SULF 17.5-3.13-1.6 GM/177ML PO SOLN: 1.0000 | Freq: Once | ORAL | 0 refills | Status: AC

## 2023-09-07 NOTE — Progress Notes (Signed)
 No egg or soy allergy known to patient  No issues known to pt with past sedation with any surgeries or procedures Patient denies ever being told they had issues or difficulty with intubation  No FH of Malignant Hyperthermia Pt is not on diet pills No GLP medications Pt is not on  home 02  Pt is not on blood thinners  Pt denies issues with constipation  No A fib or A flutter Have any cardiac testing pending--no Ambulates independently

## 2023-09-25 ENCOUNTER — Encounter: Payer: Self-pay | Admitting: Internal Medicine

## 2023-10-02 ENCOUNTER — Encounter: Payer: Self-pay | Admitting: Internal Medicine

## 2023-10-02 ENCOUNTER — Ambulatory Visit: Payer: No Typology Code available for payment source | Admitting: Internal Medicine

## 2023-10-02 VITALS — BP 126/78 | HR 67 | Temp 97.8°F | Resp 11 | Ht 67.0 in | Wt 196.0 lb

## 2023-10-02 DIAGNOSIS — Z1211 Encounter for screening for malignant neoplasm of colon: Secondary | ICD-10-CM

## 2023-10-02 MED ORDER — SODIUM CHLORIDE 0.9 % IV SOLN: 500.0000 mL | INTRAVENOUS | Status: DC

## 2023-10-02 NOTE — Op Note (Signed)
 [facility] Patient Name: Adrian Alvarez Procedure Date: 10/02/2023 2:12 PM MRN: [id number] Endoscopist: Wilhemina Bonito. Marina Goodell , MD, [id number] Age: 48 Referring MD:  Date of Birth: [birth year] Gender: Male Account #: [id number] Procedure:                Colonoscopy Indications:              Screening for colorectal malignant neoplasm Medicines:                Monitored Anesthesia Care Procedure:                Pre-Anesthesia Assessment:                           - Prior to the procedure, a History and Physical                            was performed, and patient medications and                            allergies were reviewed. The patient's tolerance of                            previous anesthesia was also reviewed. The risks                            and benefits of the procedure and the sedation                            options and risks were discussed with the patient.                            All questions were answered, and informed consent                            was obtained. Prior Anticoagulants: The patient has                            taken no anticoagulant or antiplatelet agents.                            After reviewing the risks and benefits, the patient                            was deemed in satisfactory condition to undergo the                            procedure.                           After obtaining informed consent, the colonoscope                            was passed under direct vision. Throughout the  procedure, the patient's blood pressure, pulse, and                            oxygen saturations were monitored continuously. The                            Olympus Scope SN: [id number] was introduced through                            the anus and advanced to the the cecum, identified                            by appendiceal orifice and ileocecal valve. The                            ileocecal valve, appendiceal  orifice, and rectum                            were photographed. The quality of the bowel                            preparation was excellent. The colonoscopy was                            performed without difficulty. The patient tolerated                            the procedure well. The bowel preparation used was                            SUPREP via split dose instruction. Scope In: 2:21:50 PM Scope Out: 2:34:47 PM Scope Withdrawal Time: 0 hours 10 minutes 45 seconds  Total Procedure Duration: 0 hours 12 minutes 57 seconds  Findings:                 The entire examined colon appeared normal on direct                            and retroflexion views. Complications:            No immediate complications. Estimated blood loss:                            None. Estimated Blood Loss:     Estimated blood loss: none. Impression:               - The entire examined colon is normal on direct and                            retroflexion views.                           - No specimens collected. Recommendation:           - Repeat colonoscopy in 10 years for screening  purposes.                           - Patient has a contact number available for                            emergencies. The signs and symptoms of potential                            delayed complications were discussed with the                            patient. Return to normal activities tomorrow.                            Written discharge instructions were provided to the                            patient.                           - Resume previous diet.                           - Continue present medications. Wilhemina Bonito. Marina Goodell, MD 10/02/2023 2:37:33 PM This report has been signed electronically.

## 2023-10-02 NOTE — Progress Notes (Signed)
 Sedate, gd SR, tolerated procedure well, VSS, report to RN

## 2023-10-02 NOTE — Patient Instructions (Signed)
-  repeat colonoscopy for surveillance in 10 years recommended  -Continue present medications    YOU HAD AN ENDOSCOPIC PROCEDURE TODAY AT THE [facility]:   Refer to the procedure report that was given to you for any specific questions about what was found during the examination.  If the procedure report does not answer your questions, please call your gastroenterologist to clarify.  If you requested that your care partner not be given the details of your procedure findings, then the procedure report has been included in a sealed envelope for you to review at your convenience later.  YOU SHOULD EXPECT: Some feelings of bloating in the abdomen. Passage of more gas than usual.  Walking can help get rid of the air that was put into your GI tract during the procedure and reduce the bloating. If you had a lower endoscopy (such as a colonoscopy or flexible sigmoidoscopy) you may notice spotting of blood in your stool or on the toilet paper. If you underwent a bowel prep for your procedure, you may not have a normal bowel movement for a few days.  Please Note:  You might notice some irritation and congestion in your nose or some drainage.  This is from the oxygen used during your procedure.  There is no need for concern and it should clear up in a day or so.  SYMPTOMS TO REPORT IMMEDIATELY:  Following lower endoscopy (colonoscopy or flexible sigmoidoscopy):  Excessive amounts of blood in the stool  Significant tenderness or worsening of abdominal pains  Swelling of the abdomen that is new, acute  Fever of 100F or higher  For urgent or emergent issues, a gastroenterologist can be reached at any hour by calling [phone]. Do not use MyChart messaging for urgent concerns.    DIET:  We do recommend a small meal at first, but then you may proceed to your regular diet.  Drink plenty of fluids but you should avoid alcoholic beverages for 24 hours.  ACTIVITY:  You should plan to take it easy  for the rest of today and you should NOT DRIVE or use heavy machinery until tomorrow (because of the sedation medicines used during the test).    FOLLOW UP: Our staff will call the number listed on your records the next business day following your procedure.  We will call around 7:15- 8:00 am to check on you and address any questions or concerns that you may have regarding the information given to you following your procedure. If we do not reach you, we will leave a message.     If any biopsies were taken you will be contacted by phone or by letter within the next 1-3 weeks.  Please call us at [phone] if you have not heard about the biopsies in 3 weeks.    SIGNATURES/CONFIDENTIALITY: You and/or your care partner have signed paperwork which will be entered into your electronic medical record.  These signatures attest to the fact that that the information above on your After Visit Summary has been reviewed and is understood.  Full responsibility of the confidentiality of this discharge information lies with you and/or your care-partner.

## 2023-10-02 NOTE — Progress Notes (Signed)
 HISTORY OF PRESENT ILLNESS:  Adrian Alvarez is a 48 y.o. male who is sent today for routine screening colonoscopy.  No complaints  REVIEW OF SYSTEMS:  All non-GI ROS negative except for  Past Medical History:  Diagnosis Date   Diabetes (HCC)    Hyperlipidemia    Hypertension    Pre-diabetes    Tuberculosis 2000    Past Surgical History:  Procedure Laterality Date   THORACENTESIS  2000    Social History Adrian Alvarez  reports that he has never smoked. He has never used smokeless tobacco. He reports current alcohol use of about 6.0 standard drinks of alcohol per week. He reports that he does not use drugs.  family history includes Healthy in his brother, brother, sister, sister, sister, sister, and sister; Hypertension in his father; Thyroid disease in his mother and sister.  Allergies  Allergen Reactions   Mucinex Clear & Cool Day-Night Itching       PHYSICAL EXAMINATION: Vital signs: BP 120/65   Pulse 74   Temp 97.8 F (36.6 C)   Ht 5\' 7"  (1.702 m)   Wt 196 lb (88.9 kg)   SpO2 100%   BMI 30.70 kg/m  General: Well-developed, well-nourished, no acute distress HEENT: Sclerae are anicteric, conjunctiva pink. Oral mucosa intact Lungs: Clear Heart: Regular Abdomen: soft, nontender, nondistended, no obvious ascites, no peritoneal signs, normal bowel sounds. No organomegaly. Extremities: No edema Psychiatric: alert and oriented x3. Cooperative     ASSESSMENT:  Colon cancer screening   PLAN:  Screening colonoscopy

## 2023-10-02 NOTE — Progress Notes (Signed)
 Pt's states no medical or surgical changes since previsit or office visit.

## 2023-10-05 ENCOUNTER — Telehealth: Payer: Self-pay

## 2023-10-05 NOTE — Telephone Encounter (Signed)
  Follow up Call-     10/02/2023    1:32 PM  Call back number  Post procedure Call Back phone  # Marliss Czar (daughter) [phone]  Permission to leave phone message Yes     Patient questions:  Do you have a fever, pain , or abdominal swelling? No. Pain Score  0 *  Have you tolerated food without any problems? Yes.    Have you been able to return to your normal activities? Yes.    Do you have any questions about your discharge instructions: Diet   No. Medications  No. Follow up visit  No.  Do you have questions or concerns about your Care? No.  Actions: * If pain score is 4 or above: No action needed, pain <4.

## 2023-11-12 ENCOUNTER — Telehealth: Payer: Self-pay | Admitting: Registered Nurse

## 2023-11-12 ENCOUNTER — Encounter: Payer: Self-pay | Admitting: Registered Nurse

## 2023-11-12 DIAGNOSIS — Z0000 Encounter for general adult medical examination without abnormal findings: Secondary | ICD-10-CM

## 2023-11-12 DIAGNOSIS — E7800 Pure hypercholesterolemia, unspecified: Secondary | ICD-10-CM

## 2023-11-12 MED ORDER — ATORVASTATIN CALCIUM 20 MG PO TABS
ORAL_TABLET | ORAL | 1 refills | Status: AC
Start: 2023-11-12 — End: ?

## 2023-11-12 NOTE — Telephone Encounter (Signed)
 Received refill request from [facility] for atorvastatin  10mg .  [facility] at [facility].  Dispensed 90 tabs 20mg  atorvastatin  to patient today take 1/2 tab po twice a week RF1  Next labs due November 2024.  Patient LDL 94 Hgba1c 5.8 BP 112/78 with cardiology 07/02/2023.  RN Thersia Flax to recheck weight current with patient was 194lbs BMI 30 at cardiology appt and patient to sign Be Well forms.

## 2023-12-01 ENCOUNTER — Encounter: Payer: Self-pay | Admitting: Registered Nurse

## 2023-12-01 ENCOUNTER — Telehealth: Payer: Self-pay | Admitting: Registered Nurse

## 2023-12-01 DIAGNOSIS — I10 Essential (primary) hypertension: Secondary | ICD-10-CM

## 2023-12-01 MED ORDER — HYDROCHLOROTHIAZIDE 12.5 MG PO TABS: 12.5000 mg | ORAL_TABLET | Freq: Every day | ORAL | 3 refills | Status: DC

## 2023-12-01 NOTE — Telephone Encounter (Signed)
 Patient signed Be Well forms 11/12/2023 nonsmoker/vaper in previous 12 months weight 194lbs BMI 30 BP 112/78 LDL 94 A1c 5.8 met requirements for insurance discount starting 20 Apr 2024  HR Tonya given [facility] form 12/01/2023

## 2023-12-01 NOTE — Telephone Encounter (Signed)
 Patient last filled hydrochlorothiazide  12.5mg  po daily #90 08/25/2023  Dispensed 90 tabs to patient today from [facility]  last BP 135/87 UC sick visit hives 08/04/23  last labs   Latest Reference Range & Units 06/08/23 13:55  COMPREHENSIVE METABOLIC PANEL WITH GFR  Rpt !  Sodium 135 - 145 mEq/L 139  Potassium 3.5 - 5.1 mEq/L 4.4  Chloride 96 - 112 mEq/L 103  CO2 19 - 32 mEq/L 26  Glucose 70 - 99 mg/dL 69 (L)  BUN 6 - 23 mg/dL 15  Creatinine 1.61 - 0.96 mg/dL 0.45  Calcium  8.4 - 10.5 mg/dL 40.9  Alkaline Phosphatase 39 - 117 U/L 47  Albumin 3.5 - 5.2 g/dL 4.9  AST 0 - 37 U/L 28  ALT 0 - 53 U/L 27  Total Protein 6.0 - 8.3 g/dL 7.8  Total Bilirubin 0.2 - 1.2 mg/dL 0.7  GFR >81.19 mL/min 102.67  Total CHOL/HDL Ratio  3  Cholesterol 0 - 200 mg/dL 147  HDL Cholesterol >82.95 mg/dL 62.13  LDL (calc) 0 - 99 mg/dL 94  NonHDL  086.57  Triglycerides 0.0 - 149.0 mg/dL 84.6  VLDL 0.0 - 96.2 mg/dL 9.4  VITD 95.28 - 413.24 ng/mL 50.55  Hemoglobin A1C 4.6 - 6.5 % 5.8  !: Data is abnormal (L): Data is abnormally low Rpt: View report in Results Review for more information

## 2023-12-09 ENCOUNTER — Ambulatory Visit (INDEPENDENT_AMBULATORY_CARE_PROVIDER_SITE_OTHER): Payer: No Typology Code available for payment source | Admitting: Family Medicine

## 2023-12-09 VITALS — BP 128/76 | HR 97

## 2023-12-09 DIAGNOSIS — Z87898 Personal history of other specified conditions: Secondary | ICD-10-CM | POA: Diagnosis not present

## 2023-12-09 DIAGNOSIS — E876 Hypokalemia: Secondary | ICD-10-CM

## 2023-12-09 DIAGNOSIS — E7800 Pure hypercholesterolemia, unspecified: Secondary | ICD-10-CM | POA: Diagnosis not present

## 2023-12-09 DIAGNOSIS — E559 Vitamin D deficiency, unspecified: Secondary | ICD-10-CM | POA: Diagnosis not present

## 2023-12-09 DIAGNOSIS — M25551 Pain in right hip: Secondary | ICD-10-CM

## 2023-12-09 DIAGNOSIS — Z125 Encounter for screening for malignant neoplasm of prostate: Secondary | ICD-10-CM | POA: Diagnosis not present

## 2023-12-09 DIAGNOSIS — Z0000 Encounter for general adult medical examination without abnormal findings: Secondary | ICD-10-CM | POA: Diagnosis not present

## 2023-12-09 DIAGNOSIS — M79651 Pain in right thigh: Secondary | ICD-10-CM | POA: Diagnosis not present

## 2023-12-09 DIAGNOSIS — I10 Essential (primary) hypertension: Secondary | ICD-10-CM | POA: Diagnosis not present

## 2023-12-09 DIAGNOSIS — R1031 Right lower quadrant pain: Secondary | ICD-10-CM

## 2023-12-09 MED ORDER — TRAMADOL HCL 50 MG PO TABS: 50.0000 mg | ORAL_TABLET | Freq: Four times a day (QID) | ORAL | 0 refills | Status: AC | PRN

## 2023-12-09 NOTE — Progress Notes (Signed)
 Subjective:  Patient ID: Adrian Alvarez, male    DOB: 1976-01-03  Age: 48 y.o. MRN: [id number]  CC:  Chief Complaint  Patient presents with   Annual Exam    Patient states pain from hip to groin on right side. States may be muscle strain from working out.     HPI Adrian Alvarez presents for Annual Exam And other concerns above  Right hip/groin pain 2 weeks ago sore on inside of grion for 4 days - less exercise, improved. Returned to exercise, then started exercise again 3 days ago. Slight pain, then worse last night. Trouble walking, trouble with weight bearing. no specific injury or fall. No pop.  No fever. Feels well otherwise.  Tx: ibuprofen 600mg  2 times - no relief. No meds today. No prior similar pain.   Hypertension: Treated with hydrochlorothiazide  12.5 mg daily, losartan  25 mg daily. No new side effects.  Home readings: 120/70 range or lower.   BP Readings from Last 3 Encounters:  12/09/23 128/76  10/02/23 126/78  08/04/23 135/87   Lab Results  Component Value Date   CREATININE 0.84 06/08/2023   History of prediabetes  levels improved with weight loss, diet changes previously.   Lab Results  Component Value Date   HGBA1C 5.8 06/08/2023   Wt Readings from Last 3 Encounters:  10/02/23 196 lb (88.9 kg)  09/07/23 196 lb (88.9 kg)  07/02/23 194 lb 3.2 oz (88.1 kg)   Vitamin D  deficiency Taking otc supplement.  Last vitamin D  Lab Results  Component Value Date   VD25OH 50.55 06/08/2023    Hyperlipidemia: Lipitor 20 mg - 1/2 pill 2 times per week. not fasting today.  Lab Results  Component Value Date   CHOL 158 06/08/2023   HDL 54.30 06/08/2023   LDLCALC 94 06/08/2023   TRIG 47.0 06/08/2023   CHOLHDL 3 06/08/2023   Lab Results  Component Value Date   ALT 27 06/08/2023   AST 28 06/08/2023   GGT 65 11/29/2020   ALKPHOS 47 06/08/2023   BILITOT 0.7 06/08/2023       12/09/2023    2:11 PM 06/08/2023    1:22 PM 10/06/2022    2:54 PM 04/07/2022     3:51 PM 11/25/2021    4:18 PM  Depression screen PHQ 2/9  Decreased Interest 0 0 0 0 0  Down, Depressed, Hopeless 0 0 0 0 0  PHQ - 2 Score 0 0 0 0 0  Altered sleeping  0 0 0   Tired, decreased energy  0 0 0   Change in appetite  0 0 0   Feeling bad or failure about yourself   0 0 0   Trouble concentrating  0 0 0   Moving slowly or fidgety/restless  0 0 0   Suicidal thoughts  0 0 0   PHQ-9 Score  0 0 0   Difficult doing work/chores   Not difficult at all      Health Maintenance  Topic Date Due   Pneumococcal Vaccine 51-56 Years old (1 of 2 - PCV) Never done   COVID-19 Vaccine (5 - 2024-25 season) 03/22/2023   Diabetic kidney evaluation - Urine ACR  10/06/2023   INFLUENZA VACCINE  02/19/2024   Diabetic kidney evaluation - eGFR measurement  06/07/2024   DTaP/Tdap/Td (2 - Td or Tdap) 10/05/2032   Colonoscopy  10/01/2033   Hepatitis C Screening  Completed   HIV Screening  Completed   HPV VACCINES  Aged Out  Meningococcal B Vaccine  Aged Out  Colonoscopy 10/02/23.  Prostate: does not have family history of prostate cancer The natural history of prostate cancer and ongoing controversy regarding screening and potential treatment outcomes of prostate cancer has been discussed with the patient. The meaning of a false positive PSA and a false negative PSA has been discussed. He indicates understanding of the limitations of this screening test and wishes  to proceed with screening PSA testing. Lab Results  Component Value Date   PSA1 3.1 11/29/2020   PSA1 2.1 11/05/2018   PSA1 2.2 05/01/2017   PSA 3.24 10/06/2022    Immunization History  Administered Date(s) Administered   Influenza, Seasonal, Injecte, Preservative Fre 05/14/2023   Influenza,inj,Quad PF,6+ Mos 05/16/2016, 05/08/2017, 05/02/2019, 05/03/2020, 05/09/2021   Influenza-Unspecified 05/02/2020, 03/21/2022   PFIZER(Purple Top)SARS-COV-2 Vaccination 10/13/2019, 11/04/2019, 06/02/2020   Pfizer(Comirnaty )Fall Seasonal Vaccine 12  years and older 08/08/2022   Tdap 10/06/2022  Covid booster - planned at work.   No results found. No corrective lenses. No vision changes.   Dental: every 6 months.   Alcohol: rare - once per month or less.   Tobacco: none  Exercise: gym exercise 4-5 times per week.      History Patient Active Problem List   Diagnosis Date Noted   Essential hypertension 04/16/2022   Pre-diabetes 01/23/2021   Past Medical History:  Diagnosis Date   Diabetes (HCC)    Hyperlipidemia    Hypertension    Pre-diabetes    Tuberculosis 2000   Past Surgical History:  Procedure Laterality Date   THORACENTESIS  2000   Allergies  Allergen Reactions   Mucinex Clear & Cool Day-Night Itching   Prior to Admission medications   Medication Sig Start Date End Date Taking? Authorizing Provider  atorvastatin  (LIPITOR) 20 MG tablet Take 1/2 tablet by mouth twice a week 11/12/23  Yes Betancourt, Tina A, NP  hydrochlorothiazide  (HYDRODIURIL ) 12.5 MG tablet Take 1 tablet (12.5 mg total) by mouth daily. 12/01/23 02/29/24 Yes Betancourt, Cleotis Daily, NP  ibuprofen (ADVIL) 200 MG tablet Take 200 mg by mouth every 6 (six) hours as needed for mild pain (pain score 1-3) or moderate pain (pain score 4-6).   Yes [provider]  losartan  (COZAAR ) 25 MG tablet Take 1 tablet (25 mg total) by mouth every morning. 06/08/23  Yes Benjiman Bras, MD   Social History   Socioeconomic History   Marital status: Married    Spouse name: Sarrah Cure   Number of children: 1   Years of education: Not on file   Highest education level: Not on file  Occupational History   Not on file  Tobacco Use   Smoking status: Never   Smokeless tobacco: Never  Vaping Use   Vaping status: Never Used  Substance and Sexual Activity   Alcohol use: Yes    Alcohol/week: 6.0 standard drinks of alcohol    Types: 6 Standard drinks or equivalent per week    Comment: occ   Drug use: No   Sexual activity: Not on file  Other Topics Concern    Not on file  Social History Narrative   [place]   Social Drivers of Health   Financial Resource Strain: Low Risk  (12/09/2023)   Overall Financial Resource Strain (CARDIA)    Difficulty of Paying Living Expenses: Not hard at all  Food Insecurity: Not on file  Transportation Needs: Not on file  Physical Activity: Sufficiently Active (12/09/2023)   Exercise Vital Sign  Days of Exercise per Week: 4 days    Minutes of Exercise per Session: 90 min  Stress: No Stress Concern Present (12/09/2023)   [employer] of Occupational Health - Occupational Stress Questionnaire    Feeling of Stress : Not at all  Social Connections: Moderately Integrated (12/09/2023)   Social Connection and Isolation Panel [NHANES]    Frequency of Communication with Friends and Family: More than three times a week    Frequency of Social Gatherings with Friends and Family: Once a week    Attends Religious Services: More than 4 times per year    Active Member of [employer] or Organizations: No    Attends Banker Meetings: Never    Marital Status: Married  Catering manager Violence: Not on file    Review of Systems   Objective:   Vitals:   12/09/23 1413  BP: 128/76  Pulse: 97  SpO2: 98%     Physical Exam Vitals reviewed.  Constitutional:      Appearance: He is well-developed.  HENT:     Head: Normocephalic and atraumatic.     Right Ear: External ear normal.     Left Ear: External ear normal.  Eyes:     Conjunctiva/sclera: Conjunctivae normal.     Pupils: Pupils are equal, round, and reactive to light.  Neck:     Thyroid: No thyromegaly.  Cardiovascular:     Rate and Rhythm: Normal rate and regular rhythm.     Heart sounds: Normal heart sounds.  Pulmonary:     Effort: Pulmonary effort is normal. No respiratory distress.     Breath sounds: Normal breath sounds. No wheezing.  Abdominal:     General: There is no distension.     Palpations: Abdomen is soft.      Tenderness: There is no abdominal tenderness.  Musculoskeletal:     Cervical back: Normal range of motion and neck supple.     Comments: Lumbar spine nontender, sciatic notch nontender.  Negative seated straight leg raise.  Right hip, tender over the lateral hip, trochanteric bursa as well as into anterior hip, and area of inguinal fold.  Skin intact, no erythema or soft tissue swelling.  No hernia appreciated.  Pain with motion of either internal or external range of motion of right hip, guarded.  Slight discomfort along the quadriceps, upper aspect primarily.  Knee nontender, pain-free range of motion of knee and lower extremity.   Lymphadenopathy:     Cervical: No cervical adenopathy.  Skin:    General: Skin is warm and dry.  Neurological:     Mental Status: He is alert and oriented to person, place, and time.     Deep Tendon Reflexes: Reflexes are normal and symmetric.  Psychiatric:        Behavior: Behavior normal.        Assessment & Plan:  Adrian Alvarez is a 48 y.o. male . Annual physical exam  - -anticipatory guidance as below in AVS, screening labs above. Health maintenance items as above in HPI discussed/recommended as applicable.   History of prediabetes - Plan: Hemoglobin A1c  - Check labs and adjust plan accordingly  Benign hypertension - Plan: Comprehensive metabolic panel with GFR  - Stable, continue same regimen with losartan  and HCTZ.  Check labs and adjust plan accordingly  Vitamin D  deficiency - Plan: Vitamin D  (25 hydroxy) Check updated level and adjust plan accordingly  Elevated LDL cholesterol level - Plan: Comprehensive metabolic panel with GFR, Lipid panel For  Lipitor, continue same dose, labs as above.  Screening for prostate cancer - Plan: PSA  Right hip pain - Plan: DG FEMUR, MIN 2 VIEWS RIGHT, DG HIP UNILAT W OR W/O PELVIS 2-3 VIEWS RIGHT, traMADol (ULTRAM) 50 MG tablet Right groin pain - Plan: DG FEMUR, MIN 2 VIEWS RIGHT, DG HIP UNILAT W OR W/O  PELVIS 2-3 VIEWS RIGHT, traMADol (ULTRAM) 50 MG tablet Right thigh pain - Plan: CK, traMADol (ULTRAM) 50 MG tablet  - Imaging of hip, thigh and CPK levels were reassuring.  Question strain, or lumbar source.  Unlikely fracture, unlikely labral tear without specific injury.doubt infectious cause.  Symptomatic care initially with tramadol, relative rest, range of motion and RTC precautions if not improving.  Will follow-up closely in 1 week.  ER precautions if worsening symptoms or new symptoms.  Meds ordered this encounter  Medications   traMADol (ULTRAM) 50 MG tablet    Sig: Take 1 tablet (50 mg total) by mouth every 6 (six) hours as needed for up to 5 days.    Dispense:  15 tablet    Refill:  0   Patient Instructions  Please have x-ray performed today, based on x-ray results and your lab results we can discuss next steps for the hip pain.  Keep weight off that area for now, ibuprofen if needed, tramadol if needed for more severe pain.  Follow-up with me in 1 week, but if any worsening symptoms sooner to be seen here or through ER if needed.  No other med changes today, I will check some of the screening labs for your physical and let you know if there are concerns.  Take care!      Signed,   Caro Christmas, MD [facility], [facility] 12/09/23 3:29 PM

## 2023-12-09 NOTE — Patient Instructions (Addendum)
 Please have x-ray performed today, based on x-ray results and your lab results we can discuss next steps for the hip pain.  Keep weight off that area for now, ibuprofen if needed, tramadol if needed for more severe pain.  Follow-up with me in 1 week, but if any worsening symptoms sooner to be seen here or through ER if needed.  No other med changes today, I will check some of the screening labs for your physical and let you know if there are concerns.  Take care!  Adrian Alvarez Lab or xray: Walk in 8:30-4:30 during weekdays, no appointment needed [address]  [place], [place] [address]

## 2023-12-10 ENCOUNTER — Ambulatory Visit (INDEPENDENT_AMBULATORY_CARE_PROVIDER_SITE_OTHER)
Admission: RE | Admit: 2023-12-10 | Discharge: 2023-12-10 | Disposition: A | Discharge status: Home / Self Care | Source: Ambulatory Visit | Attending: Family Medicine | Admitting: Family Medicine

## 2023-12-10 DIAGNOSIS — R1031 Right lower quadrant pain: Secondary | ICD-10-CM | POA: Diagnosis not present

## 2023-12-10 DIAGNOSIS — M25551 Pain in right hip: Secondary | ICD-10-CM | POA: Diagnosis not present

## 2023-12-10 LAB — LIPID PANEL
Cholesterol: 187 mg/dL (ref 0–200)
HDL: 64.7 mg/dL (ref >39.00)
LDL Cholesterol: 110 mg/dL — ABNORMAL HIGH (ref 0–99)
NonHDL: 122.66
Total CHOL/HDL Ratio: 3
Triglycerides: 63 mg/dL (ref 0.0–149.0)
VLDL: 12.6 mg/dL (ref 0.0–40.0)

## 2023-12-10 LAB — COMPREHENSIVE METABOLIC PANEL WITH GFR
ALT: 19 U/L (ref 0–53)
AST: 18 U/L (ref 0–37)
Albumin: 4.8 g/dL (ref 3.5–5.2)
Alkaline Phosphatase: 51 U/L (ref 39–117)
BUN: 17 mg/dL (ref 6–23)
CO2: 28 meq/L (ref 19–32)
Calcium: 9.5 mg/dL (ref 8.4–10.5)
Chloride: 100 meq/L (ref 96–112)
Creatinine, Ser: 0.77 mg/dL (ref 0.40–1.50)
GFR: 105.03 mL/min (ref >60.00)
Glucose, Bld: 114 mg/dL — ABNORMAL HIGH (ref 70–99)
Potassium: 4.8 meq/L (ref 3.5–5.1)
Sodium: 133 meq/L — ABNORMAL LOW (ref 135–145)
Total Bilirubin: 0.8 mg/dL (ref 0.2–1.2)
Total Protein: 7.4 g/dL (ref 6.0–8.3)

## 2023-12-10 LAB — HEMOGLOBIN A1C: Hgb A1c MFr Bld: 5.8 % (ref 4.6–6.5)

## 2023-12-10 LAB — PSA: PSA: 3.5 ng/mL (ref 0.10–4.00)

## 2023-12-10 LAB — VITAMIN D 25 HYDROXY (VIT D DEFICIENCY, FRACTURES): VITD: 31.49 ng/mL (ref 30.00–100.00)

## 2023-12-10 LAB — CK: Total CK: 145 U/L (ref 7–232)

## 2023-12-11 ENCOUNTER — Ambulatory Visit: Payer: Self-pay | Admitting: Family Medicine

## 2023-12-12 ENCOUNTER — Encounter: Payer: Self-pay | Admitting: Family Medicine

## 2023-12-15 ENCOUNTER — Telehealth: Payer: Self-pay

## 2023-12-15 NOTE — Telephone Encounter (Signed)
 Patient has been scheduled for lab only

## 2023-12-15 NOTE — Telephone Encounter (Signed)
 Copied from CRM [id number]. Topic: Clinical - Request for Lab/Test Order >> Dec 15, 2023  2:32 PM Kita Perish H wrote: Reason for CRM: Patients daughter Cleave Curling calling to schedule lab appointment for patient per Dr. Sharlie Days note but no orders in system.  Cleave Curling [phone]

## 2023-12-15 NOTE — Addendum Note (Signed)
 Addended by: Karne Ozga K on: 12/15/2023 03:29 PM   Modules accepted: Orders

## 2023-12-17 ENCOUNTER — Other Ambulatory Visit (INDEPENDENT_AMBULATORY_CARE_PROVIDER_SITE_OTHER)

## 2023-12-17 ENCOUNTER — Ambulatory Visit: Admitting: Family Medicine

## 2023-12-17 DIAGNOSIS — E876 Hypokalemia: Secondary | ICD-10-CM

## 2023-12-18 LAB — BASIC METABOLIC PANEL WITH GFR
BUN: 15 mg/dL (ref 6–23)
CO2: 25 meq/L (ref 19–32)
Calcium: 9.6 mg/dL (ref 8.4–10.5)
Chloride: 102 meq/L (ref 96–112)
Creatinine, Ser: 0.9 mg/dL (ref 0.40–1.50)
GFR: 100.18 mL/min (ref >60.00)
Glucose, Bld: 78 mg/dL (ref 70–99)
Potassium: 4.2 meq/L (ref 3.5–5.1)
Sodium: 137 meq/L (ref 135–145)

## 2023-12-24 ENCOUNTER — Telehealth: Payer: Self-pay | Admitting: Registered Nurse

## 2023-12-24 ENCOUNTER — Encounter: Payer: Self-pay | Admitting: Registered Nurse

## 2023-12-24 DIAGNOSIS — I10 Essential (primary) hypertension: Secondary | ICD-10-CM

## 2023-12-24 MED ORDER — LOSARTAN POTASSIUM 25 MG PO TABS: 25.0000 mg | ORAL_TABLET | Freq: Every morning | ORAL | 0 refills | Status: DC

## 2023-12-24 NOTE — Telephone Encounter (Signed)
 Latest Reference Range & Units 12/09/23 15:22 12/09/23 15:36 12/17/23 15:20  COMPREHENSIVE METABOLIC PANEL WITH GFR  Rpt !    Sodium 135 - 145 mEq/L 133 (L)  137  Potassium 3.5 - 5.1 mEq/L 4.8  4.2  Chloride 96 - 112 mEq/L 100  102  CO2 19 - 32 mEq/L 28  25  Glucose 70 - 99 mg/dL 161 (H)  78  BUN 6 - 23 mg/dL 17  15  Creatinine 0.96 - 1.50 mg/dL 0.45  4.09  Calcium  8.4 - 10.5 mg/dL 9.5  9.6  Alkaline Phosphatase 39 - 117 U/L 51    Albumin 3.5 - 5.2 g/dL 4.8    AST 0 - 37 U/L 18    ALT 0 - 53 U/L 19    Total Protein 6.0 - 8.3 g/dL 7.4    Total Bilirubin 0.2 - 1.2 mg/dL 0.8    GFR >81.19 mL/min 105.03  100.18  CK Total 7 - 232 U/L  145   Total CHOL/HDL Ratio  3    Cholesterol 0 - 200 mg/dL 147    HDL Cholesterol >39.00 mg/dL 82.95    LDL (calc) 0 - 99 mg/dL 621 (H)    NonHDL  308.65    Triglycerides 0.0 - 149.0 mg/dL 78.4    VLDL 0.0 - 69.6 mg/dL 29.5    VITD 28.41 - 324.40 ng/mL 31.49    Hemoglobin A1C 4.6 - 6.5 % 5.8    PSA 0.10 - 4.00 ng/mL 3.50    !: Data is abnormal (L): Data is abnormally low (H): Data is abnormally high Rpt: View report in Results Review for more information  Patient requested Rx to [facility] for 90 tabs as will run out on Monday and requested from [facility] but pharmacy has not received reply yet  Electronic losartan  25mg  po daily #90 RF0 to his pharmacy of choice Last BP 128/76  Patient A&Ox3 respirations even and unlabored RA skin warm dry and pink no edema noted lower extremities gait sure and steady

## 2024-03-18 ENCOUNTER — Other Ambulatory Visit: Payer: Self-pay | Admitting: Registered Nurse

## 2024-03-18 DIAGNOSIS — I10 Essential (primary) hypertension: Secondary | ICD-10-CM

## 2024-03-24 ENCOUNTER — Other Ambulatory Visit: Payer: Self-pay | Admitting: Family Medicine

## 2024-03-24 DIAGNOSIS — I10 Essential (primary) hypertension: Secondary | ICD-10-CM

## 2024-03-29 ENCOUNTER — Ambulatory Visit: Payer: Self-pay | Admitting: Registered Nurse

## 2024-03-29 DIAGNOSIS — S81811A Laceration without foreign body, right lower leg, initial encounter: Secondary | ICD-10-CM

## 2024-03-30 ENCOUNTER — Encounter: Payer: Self-pay | Admitting: Registered Nurse

## 2024-03-30 MED ORDER — TRIPLE ANTIBIOTIC 3.5-400-5000 EX OINT: 1.0000 | TOPICAL_OINTMENT | Freq: Two times a day (BID) | CUTANEOUS | Status: AC

## 2024-03-30 NOTE — Patient Instructions (Signed)
Laceration Care, Adult A laceration is a cut that may go through all layers of the skin and into the tissue that is right under the skin. Some lacerations heal on their own. Others need to be closed with stitches (sutures), staples, skin adhesive strips, or skin glue. Proper care of a laceration reduces the risk for infection, helps the laceration heal better, and may prevent scarring. General tips Keep the wound clean and dry. Do not scratch or pick at the wound. Wash your hands with soap and water for at least 20 seconds before and after touching your wound or changing your bandage (dressing). If soap and water are not available, use hand sanitizer. Do not usedisinfectants or antiseptics, such as rubbing alcohol, to clean your wound unless told by your health care provider. If you were given a dressing, you should change it at least once a day, or as told by your health care provider. You should also change it if it becomes wet or dirty. How to care for your laceration If sutures or staples were used: Keep the wound completely dry for the first 24 hours, or as told by your health care provider. After that time, you may shower or bathe. Do not soak your wound in water until after the sutures or staples have been removed. Clean the wound once each day, or as told by your health care provider. To do this: Wash the wound with soap and water. Rinse the wound with water to remove all soap. Pat the wound dry with a clean towel. Do not rub the wound. After cleaning the wound, apply a thin layer of antibiotic ointment, other topical ointments, or a non-adherent dressing as told by your health care provider. This will help prevent infection and keep the dressing from sticking to the wound. Have the sutures or staples removed as told by your health care provider. Do not  remove sutures or staples yourself. If skin adhesive strips were used: Do not get the skin adhesive strips wet. You may shower or bathe,  but keep the wound dry. If the wound gets wet, pat it dry with a clean towel. Do not rub the wound. Skin adhesive strips fall off on their own. If adhesive strip edges start to loosen and curl up, you may trim the loose edges. Do not remove adhesive strips completely unless your health care provider tells you to do that. If skin glue was used: You may shower or bathe, but try to keep the wound dry. Do not soak the wound in water. After showering or bathing, pat the wound dry with a clean towel. Do not rub the wound. Do not do any activities that will make you sweat a lot until the skin glue has fallen off. Do not apply liquid, cream, or ointment medicine to the wound while the skin glue is in place. Doing this may loosen the film before the wound has healed. If a dressing is placed over the wound, do not apply tape directly over the skin glue. Doing this may cause the glue to be pulled off before the wound has healed. Do not pick at the glue. Skin glue usually remains in place for 5-10 days and then falls off the skin. Follow these instructions at home: Medicines Take over-the-counter and prescription medicines only as told by your health care provider. If you were prescribed an antibiotic medicine or ointment, take or apply it as told by your health care provider. Do not stop using it even if  your condition improves. Managing pain and swelling If directed, put ice on the injured area. To do this: Put ice in a plastic bag. Place a towel between your skin and the bag. Leave the ice on for 20 minutes, 2-3 times a day. Remove the ice if your skin turns bright red. This is very important. If you cannot feel pain, heat, or cold, you have a greater risk of damage to the area. Raise (elevate) the injured area above the level of your heart while you are sitting or lying down for the first 24-48 hours after the laceration is repaired. General instructions  Avoid any activity that could cause your wound  to reopen. Check your wound every day for signs of infection. Watch for: More redness, swelling, or pain. Fluid or blood. Warmth. Pus or a bad smell. Keep all follow-up visits. This is important. Contact a health care provider if: You received a tetanus shot and you have swelling, severe pain, redness, or bleeding at the injection site. Your closed wound breaks open. You have any of these signs of infection: More redness, swelling, or pain around your wound. Fluid or blood coming from your wound. Warmth coming from your wound. Pus or a bad smell coming from your wound. A fever. You notice something coming out of the wound, such as wood or glass. Your pain is not controlled with medicine. You notice a change in the color of your skin near your wound. You need to change the dressing often. You develop a new rash. You have numbness around the wound. Get help right away if: You develop severe swelling around the wound. Your pain suddenly increases and is severe. You develop painful lumps near the wound or on skin anywhere else on your body. You have a red streak going away from your wound. The wound is on your hand or foot, and you cannot properly move a finger or toe. The wound is on your hand or foot, and you notice that your fingers or toes look pale or bluish. Summary A laceration is a cut that may go through all layers of the skin and into the tissue that is right under the skin. Some lacerations heal on their own. Others need to be closed with stitches (sutures), staples, skin adhesive strips, or skin glue. Proper care of a laceration reduces the risk of infection, helps the laceration heal better, and may prevent scarring. This information is not intended to replace advice given to you by your health care provider. Make sure you discuss any questions you have with your health care provider. Document Revised: 09/13/2020 Document Reviewed: 09/13/2020 Elsevier Patient Education   2024 [employer].

## 2024-03-30 NOTE — Progress Notes (Signed)
 Established Patient Office Visit  Subjective   Patient ID: Adrian Alvarez, male    DOB: 10/08/1975  Age: 48 y.o. MRN: [id number]  Chief Complaint  Patient presents with   Laceration    Right leg by falling cup/broke at work today    48y/o filipino established male here for evaluation cut sustained in workcenter prior to arrival.  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  Stated had tetanus booster in the past year.  Some bleeding noted right leg has not notified supervisor yet.      Review of Systems  Constitutional:  Negative for chills and fever.  Musculoskeletal:  Negative for back pain, falls, joint pain, myalgias and neck pain.  Skin:  Negative for itching.  Neurological:  Negative for tingling, tremors, sensory change, speech change, focal weakness and weakness.      Objective:     There were no vitals taken for this visit.   Physical Exam Vitals and nursing note reviewed.  Constitutional:      General: He is awake. He is not in acute distress.    Appearance: Normal appearance. He is well-developed, well-groomed and normal weight. He is not ill-appearing, toxic-appearing or diaphoretic.  HENT:     Head: Normocephalic and atraumatic.     Jaw: There is normal jaw occlusion.     Salivary Glands: Right salivary gland is not diffusely enlarged. Left salivary gland is not diffusely enlarged.     Right Ear: Hearing and external ear normal.     Left Ear: Hearing and external ear normal.     Nose: Nose normal. No congestion or rhinorrhea.     Mouth/Throat:     Lips: Pink. No lesions.     Mouth: Mucous membranes are moist.     Pharynx: Oropharynx is clear.  Eyes:     General: Lids are normal. Vision grossly intact. Gaze aligned appropriately. No scleral icterus.       Right eye: No discharge.        Left eye: No discharge.     Extraocular Movements: Extraocular movements intact.      Conjunctiva/sclera: Conjunctivae normal.     Pupils: Pupils are equal, round, and reactive to light.  Neck:     Trachea: Trachea normal.  Cardiovascular:     Rate and Rhythm: Normal rate and regular rhythm.     Pulses: Normal pulses.  Pulmonary:     Effort: Pulmonary effort is normal. No respiratory distress.     Breath sounds: Normal breath sounds and air entry. No stridor or transmitted upper airway sounds. No wheezing, rhonchi or rales.     Comments: Spoke full sentences without difficulty; no cough observed in exam room Abdominal:     Palpations: Abdomen is soft.  Musculoskeletal:        General: Tenderness and signs of injury present. No swelling. Normal range of motion.     Right hand: Normal strength. Normal capillary refill.     Left hand: Normal strength. Normal capillary refill.     Cervical back: Normal range of motion and neck supple. No swelling, edema, deformity, erythema, signs of trauma, lacerations, rigidity, torticollis, tenderness or crepitus. No pain with movement. Normal range of motion.     Thoracic back: No swelling, edema, deformity, signs of trauma or lacerations. Normal range of motion.     Right upper leg: Laceration and tenderness present. No swelling,  edema or deformity.     Left upper leg: No swelling, edema, deformity, lacerations or tenderness.     Right knee: No swelling, deformity, effusion, erythema, ecchymosis, bony tenderness or crepitus. Normal range of motion. Tenderness present.     Left knee: No swelling, deformity, effusion, erythema, ecchymosis, bony tenderness or crepitus. Normal range of motion. No tenderness.     Right lower leg: Laceration and tenderness present. No swelling. No edema.     Left lower leg: No swelling, lacerations or tenderness. No edema.     Right ankle: No swelling, deformity, ecchymosis or lacerations. No tenderness. Normal range of motion.     Left ankle: No swelling, deformity, ecchymosis or lacerations. No tenderness.  Normal range of motion.     Comments: 1cm laceration lateral right thigh linear wound edges well approximated; bleeding capillary oozing noted; 2cm laceration lateral knee/gastrocnemius right wound edges well approximate capillary bleeding dripping from laceration; no foreign bodies visualized or palpated; full strength all extremities/sensation 5/5 gait sure and steady normal heel toe gait  Lymphadenopathy:     Cervical: No cervical adenopathy.     Right cervical: No superficial cervical adenopathy.    Left cervical: No superficial cervical adenopathy.  Skin:    General: Skin is warm and dry.     Capillary Refill: Capillary refill takes less than 2 seconds.     Coloration: Skin is not ashen, cyanotic, jaundiced, mottled, pale or sallow.     Findings: Laceration present. No abrasion, abscess, acne, bruising, burn, ecchymosis, erythema, signs of injury, lesion, petechiae, rash or wound.     Nails: There is no clubbing.         Comments: Linear lacerations x 2 right lateral lower leg EBL less than 5ml; wound edges well approximated  Neurological:     General: No focal deficit present.     Mental Status: He is alert and oriented to person, place, and time. Mental status is at baseline.     GCS: GCS eye subscore is 4. GCS verbal subscore is 5. GCS motor subscore is 6.     Cranial Nerves: Cranial nerves 2-12 are intact. No cranial nerve deficit, dysarthria or facial asymmetry.     Sensory: Sensation is intact.     Motor: Motor function is intact. No weakness, tremor, atrophy, abnormal muscle tone or seizure activity.     Coordination: Coordination is intact. Coordination normal.     Gait: Gait is intact. Gait normal.     Comments: In/out of chair and on/off exam table without difficulty; gait sure and steady in clinic; bilateral hand grasp equal 5/5  Psychiatric:        Attention and Perception: Attention and perception normal.        Mood and Affect: Mood and affect normal.        Speech:  Speech normal.        Behavior: Behavior normal. Behavior is cooperative.        Thought Content: Thought content normal.        Cognition and Memory: Cognition and memory normal.        Judgment: Judgment normal.      No results found for any visits on 03/29/24.    The 10-year ASCVD risk score (Arnett DK, et al., 2019) is: 4.8%    Assessment & Plan:   Problem List Items Addressed This Visit   None Visit Diagnoses       Leg laceration, right, initial encounter    -  Primary     well approximated not gaping did not require sutures.  Verbal consent obtained cleansed with hydrogen peroxide.  Tdap current.  Thigh laceration 2 steristrips applied and knee/gastrocnemius laceration 4 steristrips applied.  Bandaid over top.  Dressings clean dry and intact on ambulatory discharge from clinic.  Do not soak  right leg until lacerations healed avoid pool, lake, hot tub, dirty sink water. May shower apply neosporin/triple antibiotic BID keep wounds covered they will heal faster and prevent contamination rubbing from clothing tearing off scabs.  Given triple antibiotic and bandaids from clinic stock change at least daily after washing with soap and water. Allow soapy water to flow over area do not scrub laceration itself Change prn soiling. [employer] handout on laceration care Tylenol 1000mg  po q6h prn pain over the counter  may ice 15 minutes prn swelling four times per day. Medications as directed. Call or return to clinic as needed if these symptoms worsen or fail to improve as anticipated or if worsening pain/discharge/foreign object seen.  HR/safety office and supervisor notified patient treated for work injury today and patient instructed to follow up with supervisor to complete work injury report  Patient verbalized agreement and understanding of treatment plan. P2: ROM, injury    Return if symptoms worsen or fail to improve.    Ellouise DELENA Hope, NP

## 2024-05-26 ENCOUNTER — Other Ambulatory Visit: Payer: Self-pay | Admitting: Registered Nurse

## 2024-05-26 DIAGNOSIS — Z23 Encounter for immunization: Secondary | ICD-10-CM

## 2024-05-31 ENCOUNTER — Ambulatory Visit: Payer: Self-pay | Admitting: General Practice

## 2024-05-31 DIAGNOSIS — Z23 Encounter for immunization: Secondary | ICD-10-CM

## 2024-06-07 ENCOUNTER — Encounter: Payer: Self-pay | Admitting: Registered Nurse

## 2024-06-07 ENCOUNTER — Ambulatory Visit: Payer: Self-pay | Admitting: Registered Nurse

## 2024-06-07 VITALS — BP 121/81 | HR 80

## 2024-06-07 DIAGNOSIS — I10 Essential (primary) hypertension: Secondary | ICD-10-CM

## 2024-06-07 MED ORDER — HYDROCHLOROTHIAZIDE 12.5 MG PO TABS: 12.5000 mg | ORAL_TABLET | Freq: Every day | ORAL | 1 refills | Status: AC

## 2024-06-07 NOTE — Patient Instructions (Signed)
 Managing Your Hypertension Hypertension, also called high blood pressure, is when the force of the blood pressing against the walls of the arteries is too strong. Arteries are blood vessels that carry blood from your heart throughout your body. Hypertension forces the heart to work harder to pump blood and may cause the arteries to become narrow or stiff. Understanding blood pressure readings A blood pressure reading includes a higher number over a lower number: The first, or top, number is called the systolic pressure. It is a measure of the pressure in your arteries as your heart beats. The second, or bottom number, is called the diastolic pressure. It is a measure of the pressure in your arteries as the heart relaxes. For most people, a normal blood pressure is below 120/80. Your personal target blood pressure may vary depending on your medical conditions, your age, and other factors. Blood pressure is classified into four stages. Based on your blood pressure reading, your health care provider may use the following stages to determine what type of treatment you need, if any. Systolic pressure and diastolic pressure are measured in a unit called millimeters of mercury (mmHg). Normal Systolic pressure: below 120. Diastolic pressure: below 80. Elevated Systolic pressure: 120-129. Diastolic pressure: below 80. Hypertension stage 1 Systolic pressure: 130-139. Diastolic pressure: 80-89. Hypertension stage 2 Systolic pressure: 140 or above. Diastolic pressure: 90 or above. How can this condition affect me? Managing your hypertension is very important. Over time, hypertension can damage the arteries and decrease blood flow to parts of the body, including the brain, heart, and kidneys. Having untreated or uncontrolled hypertension can lead to: A heart attack. A stroke. A weakened blood vessel (aneurysm). Heart failure. Kidney damage. Eye damage. Memory and concentration problems. Vascular  dementia. What actions can I take to manage this condition? Hypertension can be managed by making lifestyle changes and possibly by taking medicines. Your health care provider will help you make a plan to bring your blood pressure within a normal range. You may be referred for counseling on a healthy diet and physical activity. Nutrition  Eat a diet that is high in fiber and potassium, and low in salt (sodium), added sugar, and fat. An example eating plan is called the DASH diet. DASH stands for Dietary Approaches to Stop Hypertension. To eat this way: Eat plenty of fresh fruits and vegetables. Try to fill one-half of your plate at each meal with fruits and vegetables. Eat whole grains, such as whole-wheat pasta, brown rice, or whole-grain bread. Fill about one-fourth of your plate with whole grains. Eat low-fat dairy products. Avoid fatty cuts of meat, processed or cured meats, and poultry with skin. Fill about one-fourth of your plate with lean proteins such as fish, chicken without skin, beans, eggs, and tofu. Avoid pre-made and processed foods. These tend to be higher in sodium, added sugar, and fat. Reduce your daily sodium intake. Many people with hypertension should eat less than 1,500 mg of sodium a day. Lifestyle  Work with your health care provider to maintain a healthy body weight or to lose weight. Ask what an ideal weight is for you. Get at least 30 minutes of exercise that causes your heart to beat faster (aerobic exercise) most days of the week. Activities may include walking, swimming, or biking. Include exercise to strengthen your muscles (resistance exercise), such as weight lifting, as part of your weekly exercise routine. Try to do these types of exercises for 30 minutes at least 3 days a week. Do  not use any products that contain nicotine  or tobacco. These products include cigarettes, chewing tobacco, and vaping devices, such as e-cigarettes. If you need help quitting, ask your  health care provider. Control any long-term (chronic) conditions you have, such as high cholesterol or diabetes. Identify your sources of stress and find ways to manage stress. This may include meditation, deep breathing, or making time for fun activities. Alcohol use Do not drink alcohol if: Your health care provider tells you not to drink. You are pregnant, may be pregnant, or are planning to become pregnant. If you drink alcohol: Limit how much you have to: 0-1 drink a day for women. 0-2 drinks a day for men. Know how much alcohol is in your drink. In the [place] one drink equals one 12 oz bottle of beer (355 mL), one 5 oz glass of wine (148 mL), or one 1 oz glass of hard liquor (44 mL). Medicines Your health care provider may prescribe medicine if lifestyle changes are not enough to get your blood pressure under control and if: Your systolic blood pressure is 130 or higher. Your diastolic blood pressure is 80 or higher. Take medicines only as told by your health care provider. Follow the directions carefully. Blood pressure medicines must be taken as told by your health care provider. The medicine does not work as well when you skip doses. Skipping doses also puts you at risk for problems. Monitoring Before you monitor your blood pressure: Do not smoke, drink caffeinated beverages, or exercise within 30 minutes before taking a measurement. Use the bathroom and empty your bladder (urinate). Sit quietly for at least 5 minutes before taking measurements. Monitor your blood pressure at home as told by your health care provider. To do this: Sit with your back straight and supported. Place your feet flat on the floor. Do not cross your legs. Support your arm on a flat surface, such as a table. Make sure your upper arm is at heart level. Each time you measure, take two or three readings one minute apart and record the results. You may also need to have your blood pressure checked regularly by  your health care provider. General information Talk with your health care provider about your diet, exercise habits, and other lifestyle factors that may be contributing to hypertension. Review all the medicines you take with your health care provider because there may be side effects or interactions. Keep all follow-up visits. Your health care provider can help you create and adjust your plan for managing your high blood pressure. Where to find more information National Heart, Lung, and Blood Institute: [employer]is American Heart Association: www.heart.org Contact a health care provider if: You think you are having a reaction to medicines you have taken. You have repeated (recurrent) headaches. You feel dizzy. You have swelling in your ankles. You have trouble with your vision. Get help right away if: You develop a severe headache or confusion. You have unusual weakness or numbness, or you feel faint. You have severe pain in your chest or abdomen. You vomit repeatedly. You have trouble breathing. These symptoms may be an emergency. Get help right away. Call 911. Do not wait to see if the symptoms will go away. Do not drive yourself to the hospital. Summary Hypertension is when the force of blood pumping through your arteries is too strong. If this condition is not controlled, it may put you at risk for serious complications. Your personal target blood pressure may vary depending on your medical conditions,  your age, and other factors. For most people, a normal blood pressure is less than 120/80. Hypertension is managed by lifestyle changes, medicines, or both. Lifestyle changes to help manage hypertension include losing weight, eating a healthy, low-sodium diet, exercising more, stopping smoking, and limiting alcohol. This information is not intended to replace advice given to you by your health care provider. Make sure you discuss any questions you have with your health care  provider. Document Revised: 03/21/2021 Document Reviewed: 03/21/2021 Elsevier Patient Education  2024 [employer].

## 2024-06-07 NOTE — Progress Notes (Signed)
 Established Patient Office Visit  Subjective   Patient ID: Adrian Alvarez, male    DOB: 09-08-1975  Age: 48 y.o. MRN: [id number]  Chief Complaint  Patient presents with   NP visit   Hypertension    Hydrochlorothiazide  12.5mg  refill [facility]    48y/o established filipino male here for refill hydrochlorothiazide  12.5mg  daily  Last filled from [facility] 03/03/24  Stated had some caffeine today.  Unsure if high salt foods prior to clinic visit.  Feeling well denied concerns or daily headaches/leg/feet swelling/muscle cramps/palpitations.  Current on annual labs.      Review of Systems  Constitutional:  Negative for chills, diaphoresis, fever and malaise/fatigue.  Eyes:  Negative for blurred vision.  Cardiovascular:  Negative for palpitations and leg swelling.  Musculoskeletal:  Negative for myalgias.  Neurological:  Negative for dizziness, tingling, tremors, focal weakness, weakness and headaches.      Objective:     BP 121/81 (BP Location: Left Arm, Patient Position: Sitting, Cuff Size: Normal)   Pulse 80   SpO2 97%    Physical Exam Vitals and nursing note reviewed.  Constitutional:      General: He is awake. He is not in acute distress.    Appearance: Normal appearance. He is well-developed, well-groomed and normal weight. He is not ill-appearing, toxic-appearing or diaphoretic.  HENT:     Head: Normocephalic and atraumatic.     Jaw: There is normal jaw occlusion.     Salivary Glands: Right salivary gland is not diffusely enlarged. Left salivary gland is not diffusely enlarged.     Right Ear: Hearing and external ear normal.     Left Ear: Hearing and external ear normal.     Nose: Nose normal. No congestion or rhinorrhea.     Mouth/Throat:     Lips: Pink. No lesions.     Mouth: Mucous membranes are moist.     Pharynx: Oropharynx is clear.  Eyes:     General: Lids are normal. Vision grossly intact. Gaze aligned appropriately. No scleral icterus.       Right eye: No  discharge.        Left eye: No discharge.     Extraocular Movements: Extraocular movements intact.     Conjunctiva/sclera: Conjunctivae normal.     Pupils: Pupils are equal, round, and reactive to light.  Neck:     Trachea: Trachea normal.  Cardiovascular:     Rate and Rhythm: Normal rate and regular rhythm.     Pulses: Normal pulses.  Pulmonary:     Effort: Pulmonary effort is normal. No respiratory distress.     Breath sounds: Normal breath sounds and air entry. No stridor or transmitted upper airway sounds. No wheezing.     Comments: Spoke full sentences without difficulty; no cough observed in exam room Abdominal:     Palpations: Abdomen is soft.  Musculoskeletal:        General: Normal range of motion.     Cervical back: Normal range of motion and neck supple. No rigidity.     Right lower leg: No edema.     Left lower leg: No edema.  Lymphadenopathy:     Head:     Right side of head: No submandibular or preauricular adenopathy.     Left side of head: No submandibular or preauricular adenopathy.     Cervical: No cervical adenopathy.     Right cervical: No superficial cervical adenopathy.    Left cervical: No superficial cervical adenopathy.  Skin:  General: Skin is warm and dry.     Capillary Refill: Capillary refill takes less than 2 seconds.     Coloration: Skin is not ashen, cyanotic, jaundiced, mottled, pale or sallow.     Findings: No abrasion, bruising, burn, erythema, signs of injury, laceration, lesion, petechiae, rash or wound.  Neurological:     General: No focal deficit present.     Mental Status: He is alert and oriented to person, place, and time. Mental status is at baseline.     Cranial Nerves: No cranial nerve deficit.     Motor: Motor function is intact. No weakness, tremor, abnormal muscle tone or seizure activity.     Coordination: Coordination is intact. Coordination normal.     Gait: Gait is intact. Gait normal.     Comments: In/out of chair without  difficulty; gait sure and steady in clinic; bilateral hand grasp equal 5/5  Psychiatric:        Attention and Perception: Attention and perception normal.        Mood and Affect: Mood and affect normal.        Speech: Speech normal.        Behavior: Behavior normal. Behavior is cooperative.        Thought Content: Thought content normal.        Cognition and Memory: Cognition and memory normal.        Judgment: Judgment normal.      No results found for any visits on 06/07/24.     Latest Reference Range & Units 12/09/23 15:22 12/09/23 15:36 12/17/23 15:20  Sodium 135 - 145 mEq/L 133 (L)  137  Potassium 3.5 - 5.1 mEq/L 4.8  4.2  Chloride 96 - 112 mEq/L 100  102  CO2 19 - 32 mEq/L 28  25  Glucose 70 - 99 mg/dL 885 (H)  78  BUN 6 - 23 mg/dL 17  15  Creatinine 9.59 - 1.50 mg/dL 9.22  9.09  Calcium  8.4 - 10.5 mg/dL 9.5  9.6  Alkaline Phosphatase 39 - 117 U/L 51    Albumin 3.5 - 5.2 g/dL 4.8    AST 0 - 37 U/L 18    ALT 0 - 53 U/L 19    Total Protein 6.0 - 8.3 g/dL 7.4    Total Bilirubin 0.2 - 1.2 mg/dL 0.8    GFR >39.99 mL/min 105.03  100.18  CK Total 7 - 232 U/L  145   Total CHOL/HDL Ratio  3    Cholesterol 0 - 200 mg/dL 812    HDL Cholesterol >39.00 mg/dL 35.29    LDL (calc) 0 - 99 mg/dL 889 (H)    NonHDL  877.33    Triglycerides 0.0 - 149.0 mg/dL 36.9    VLDL 0.0 - 59.9 mg/dL 87.3    VITD 69.99 - 899.99 ng/mL 31.49    Hemoglobin A1C 4.6 - 6.5 % 5.8    PSA 0.10 - 4.00 ng/mL 3.50    (L): Data is abnormally low (H): Data is abnormally high  The 10-year ASCVD risk score (Arnett DK, et al., 2019) is: 4.9%    Assessment & Plan:   Problem List Items Addressed This Visit       Cardiovascular and Mediastinum   Essential hypertension - Primary   Relevant Medications   hydrochlorothiazide  (HYDRODIURIL ) 12.5 MG tablet  Refilled hydrochlorothiazide  12.5mg  po daily #90 RF1 from [facility] today.  Recommended DASH diet.  Avoid weight gain.  Next labs due May 2026 [employer]  panel and Hgba1c  Discussed diastolic a few points above goal of less than 80 today systolic less than 130.  Continue activity 150 minutes per week.  Patient agreed with plan of care and had no further questions at this time.  No follow-ups on file.    Ellouise DELENA Hope, NP

## 2024-06-18 ENCOUNTER — Other Ambulatory Visit: Payer: Self-pay | Admitting: Registered Nurse

## 2024-06-18 DIAGNOSIS — I10 Essential (primary) hypertension: Secondary | ICD-10-CM

## 2024-06-20 ENCOUNTER — Encounter: Payer: Self-pay | Admitting: Registered Nurse

## 2024-06-20 NOTE — Telephone Encounter (Signed)
 Patient has been chronically on losartan  25mg  po daily and hydrochlorothiazide  12.5mg  po daily  Last PCM appt 12/09/23 BP 128/76 HR 97 labs stable electronic Rx sent to his pharmacy of choice losartan  25mg  po daily #90 RF0  Telephone message left for patient Rx refill received and sent to his pharmacy today.  Latest Reference Range & Units 12/09/23 15:22 12/09/23 15:36 12/17/23 15:20  Comprehensive metabolic panel with GFR  Rpt !    Sodium 135 - 145 mEq/L 133 (L)  137  Potassium 3.5 - 5.1 mEq/L 4.8  4.2  Chloride 96 - 112 mEq/L 100  102  CO2 19 - 32 mEq/L 28  25  Glucose 70 - 99 mg/dL 885 (H)  78  BUN 6 - 23 mg/dL 17  15  Creatinine 9.59 - 1.50 mg/dL 9.22  9.09  Calcium  8.4 - 10.5 mg/dL 9.5  9.6  Alkaline Phosphatase 39 - 117 U/L 51    Albumin 3.5 - 5.2 g/dL 4.8    AST 0 - 37 U/L 18    ALT 0 - 53 U/L 19    Total Protein 6.0 - 8.3 g/dL 7.4    Total Bilirubin 0.2 - 1.2 mg/dL 0.8    GFR >39.99 mL/min 105.03  100.18  CK Total 7 - 232 U/L  145   Total CHOL/HDL Ratio  3    Cholesterol 0 - 200 mg/dL 812    HDL Cholesterol >39.00 mg/dL 35.29    LDL (calc) 0 - 99 mg/dL 889 (H)    NonHDL  877.33    Triglycerides 0.0 - 149.0 mg/dL 36.9    VLDL 0.0 - 59.9 mg/dL 87.3    VITD 69.99 - 899.99 ng/mL 31.49    Hemoglobin A1C 4.6 - 6.5 % 5.8    PSA 0.10 - 4.00 ng/mL 3.50    !: Data is abnormal (L): Data is abnormally low (H): Data is abnormally high Rpt: View report in Results Review for more information

## 2024-06-27 ENCOUNTER — Ambulatory Visit: Admitting: Family Medicine

## 2024-06-30 ENCOUNTER — Ambulatory Visit: Payer: Self-pay | Admitting: Registered Nurse

## 2024-06-30 ENCOUNTER — Encounter: Payer: Self-pay | Admitting: Registered Nurse

## 2024-06-30 DIAGNOSIS — Z0000 Encounter for general adult medical examination without abnormal findings: Secondary | ICD-10-CM

## 2024-06-30 DIAGNOSIS — I10 Essential (primary) hypertension: Secondary | ICD-10-CM

## 2024-06-30 NOTE — Progress Notes (Signed)
 Adrian Alvarez presents to the clinic requesting medication refills. Clinic NP has sent the prescriptions to his current pharmacy. No other needs.

## 2024-06-30 NOTE — Patient Instructions (Signed)
 Managing Your Hypertension Hypertension, also called high blood pressure, is when the force of the blood pressing against the walls of the arteries is too strong. Arteries are blood vessels that carry blood from your heart throughout your body. Hypertension forces the heart to work harder to pump blood and may cause the arteries to become narrow or stiff. Understanding blood pressure readings A blood pressure reading includes a higher number over a lower number: The first, or top, number is called the systolic pressure. It is a measure of the pressure in your arteries as your heart beats. The second, or bottom number, is called the diastolic pressure. It is a measure of the pressure in your arteries as the heart relaxes. For most people, a normal blood pressure is below 120/80. Your personal target blood pressure may vary depending on your medical conditions, your age, and other factors. Blood pressure is classified into four stages. Based on your blood pressure reading, your health care provider may use the following stages to determine what type of treatment you need, if any. Systolic pressure and diastolic pressure are measured in a unit called millimeters of mercury (mmHg). Normal Systolic pressure: below 120. Diastolic pressure: below 80. Elevated Systolic pressure: 120-129. Diastolic pressure: below 80. Hypertension stage 1 Systolic pressure: 130-139. Diastolic pressure: 80-89. Hypertension stage 2 Systolic pressure: 140 or above. Diastolic pressure: 90 or above. How can this condition affect me? Managing your hypertension is very important. Over time, hypertension can damage the arteries and decrease blood flow to parts of the body, including the brain, heart, and kidneys. Having untreated or uncontrolled hypertension can lead to: A heart attack. A stroke. A weakened blood vessel (aneurysm). Heart failure. Kidney damage. Eye damage. Memory and concentration problems. Vascular  dementia. What actions can I take to manage this condition? Hypertension can be managed by making lifestyle changes and possibly by taking medicines. Your health care provider will help you make a plan to bring your blood pressure within a normal range. You may be referred for counseling on a healthy diet and physical activity. Nutrition  Eat a diet that is high in fiber and potassium, and low in salt (sodium), added sugar, and fat. An example eating plan is called the DASH diet. DASH stands for Dietary Approaches to Stop Hypertension. To eat this way: Eat plenty of fresh fruits and vegetables. Try to fill one-half of your plate at each meal with fruits and vegetables. Eat whole grains, such as whole-wheat pasta, brown rice, or whole-grain bread. Fill about one-fourth of your plate with whole grains. Eat low-fat dairy products. Avoid fatty cuts of meat, processed or cured meats, and poultry with skin. Fill about one-fourth of your plate with lean proteins such as fish, chicken without skin, beans, eggs, and tofu. Avoid pre-made and processed foods. These tend to be higher in sodium, added sugar, and fat. Reduce your daily sodium intake. Many people with hypertension should eat less than 1,500 mg of sodium a day. Lifestyle  Work with your health care provider to maintain a healthy body weight or to lose weight. Ask what an ideal weight is for you. Get at least 30 minutes of exercise that causes your heart to beat faster (aerobic exercise) most days of the week. Activities may include walking, swimming, or biking. Include exercise to strengthen your muscles (resistance exercise), such as weight lifting, as part of your weekly exercise routine. Try to do these types of exercises for 30 minutes at least 3 days a week. Do  not use any products that contain nicotine  or tobacco. These products include cigarettes, chewing tobacco, and vaping devices, such as e-cigarettes. If you need help quitting, ask your  health care provider. Control any long-term (chronic) conditions you have, such as high cholesterol or diabetes. Identify your sources of stress and find ways to manage stress. This may include meditation, deep breathing, or making time for fun activities. Alcohol use Do not drink alcohol if: Your health care provider tells you not to drink. You are pregnant, may be pregnant, or are planning to become pregnant. If you drink alcohol: Limit how much you have to: 0-1 drink a day for women. 0-2 drinks a day for men. Know how much alcohol is in your drink. In the [place] one drink equals one 12 oz bottle of beer (355 mL), one 5 oz glass of wine (148 mL), or one 1 oz glass of hard liquor (44 mL). Medicines Your health care provider may prescribe medicine if lifestyle changes are not enough to get your blood pressure under control and if: Your systolic blood pressure is 130 or higher. Your diastolic blood pressure is 80 or higher. Take medicines only as told by your health care provider. Follow the directions carefully. Blood pressure medicines must be taken as told by your health care provider. The medicine does not work as well when you skip doses. Skipping doses also puts you at risk for problems. Monitoring Before you monitor your blood pressure: Do not smoke, drink caffeinated beverages, or exercise within 30 minutes before taking a measurement. Use the bathroom and empty your bladder (urinate). Sit quietly for at least 5 minutes before taking measurements. Monitor your blood pressure at home as told by your health care provider. To do this: Sit with your back straight and supported. Place your feet flat on the floor. Do not cross your legs. Support your arm on a flat surface, such as a table. Make sure your upper arm is at heart level. Each time you measure, take two or three readings one minute apart and record the results. You may also need to have your blood pressure checked regularly by  your health care provider. General information Talk with your health care provider about your diet, exercise habits, and other lifestyle factors that may be contributing to hypertension. Review all the medicines you take with your health care provider because there may be side effects or interactions. Keep all follow-up visits. Your health care provider can help you create and adjust your plan for managing your high blood pressure. Where to find more information National Heart, Lung, and Blood Institute: [employer]is American Heart Association: www.heart.org Contact a health care provider if: You think you are having a reaction to medicines you have taken. You have repeated (recurrent) headaches. You feel dizzy. You have swelling in your ankles. You have trouble with your vision. Get help right away if: You develop a severe headache or confusion. You have unusual weakness or numbness, or you feel faint. You have severe pain in your chest or abdomen. You vomit repeatedly. You have trouble breathing. These symptoms may be an emergency. Get help right away. Call 911. Do not wait to see if the symptoms will go away. Do not drive yourself to the hospital. Summary Hypertension is when the force of blood pumping through your arteries is too strong. If this condition is not controlled, it may put you at risk for serious complications. Your personal target blood pressure may vary depending on your medical conditions,  your age, and other factors. For most people, a normal blood pressure is less than 120/80. Hypertension is managed by lifestyle changes, medicines, or both. Lifestyle changes to help manage hypertension include losing weight, eating a healthy, low-sodium diet, exercising more, stopping smoking, and limiting alcohol. This information is not intended to replace advice given to you by your health care provider. Make sure you discuss any questions you have with your health care  provider. Document Revised: 03/21/2021 Document Reviewed: 03/21/2021 Elsevier Patient Education  2024 [employer].

## 2024-06-30 NOTE — Progress Notes (Signed)
 Established Patient Office Visit  Subjective   Patient ID: Adrian Alvarez, male    DOB: Jun 09, 1976  Age: 48 y.o. MRN: [id number]  Chief Complaint  Patient presents with   losartan  refill    48y/o filipinno male established married patient denied problems with medication or side effects working well for him.  Last labs May 2025      Review of Systems  Cardiovascular:  Negative for chest pain, palpitations and leg swelling.  Neurological:  Negative for dizziness and headaches.      Objective:     There were no vitals taken for this visit.   Physical Exam Vitals and nursing note reviewed.  Constitutional:      General: He is awake. He is not in acute distress.    Appearance: Normal appearance. He is well-developed and well-groomed. He is not ill-appearing, toxic-appearing or diaphoretic.  HENT:     Head: Normocephalic and atraumatic.     Jaw: There is normal jaw occlusion.     Salivary Glands: Right salivary gland is not diffusely enlarged. Left salivary gland is not diffusely enlarged.     Right Ear: Hearing and external ear normal.     Left Ear: Hearing and external ear normal.     Nose: Nose normal. No congestion or rhinorrhea.     Mouth/Throat:     Lips: Pink. No lesions.     Mouth: Mucous membranes are moist.     Pharynx: Oropharynx is clear.  Eyes:     General: Lids are normal. Vision grossly intact. Gaze aligned appropriately. No scleral icterus.       Right eye: No discharge.        Left eye: No discharge.     Extraocular Movements: Extraocular movements intact.     Conjunctiva/sclera: Conjunctivae normal.     Pupils: Pupils are equal, round, and reactive to light.  Neck:     Trachea: Trachea normal.  Pulmonary:     Effort: Pulmonary effort is normal. No respiratory distress.     Breath sounds: Normal breath sounds and air entry. No stridor or transmitted upper airway sounds. No wheezing.     Comments: Spoke full sentences without difficulty; no cough  observed in exam room Abdominal:     General: Abdomen is flat.  Musculoskeletal:        General: Normal range of motion.     Cervical back: Normal range of motion and neck supple. No rigidity.     Right lower leg: No edema.     Left lower leg: No edema.  Lymphadenopathy:     Head:     Right side of head: No submandibular or preauricular adenopathy.     Left side of head: No submandibular or preauricular adenopathy.     Cervical:     Right cervical: No superficial cervical adenopathy.    Left cervical: No superficial cervical adenopathy.  Skin:    General: Skin is warm and dry.     Capillary Refill: Capillary refill takes less than 2 seconds.     Coloration: Skin is not ashen, cyanotic, jaundiced, mottled, pale or sallow.     Findings: No abrasion, bruising, burn, erythema, signs of injury, laceration, lesion, petechiae, rash or wound.  Neurological:     General: No focal deficit present.     Mental Status: He is alert and oriented to person, place, and time. Mental status is at baseline.     Cranial Nerves: No cranial nerve deficit.  Motor: Motor function is intact. No weakness, tremor, abnormal muscle tone or seizure activity.     Coordination: Coordination is intact. Coordination normal.     Gait: Gait is intact. Gait normal.     Comments: In/out of chair without difficulty; gait sure and steady in clinic; bilateral hand grasp equal 5/5  Psychiatric:        Attention and Perception: Attention and perception normal.        Mood and Affect: Mood and affect normal.        Speech: Speech normal.        Behavior: Behavior normal. Behavior is cooperative.        Thought Content: Thought content normal.        Cognition and Memory: Cognition and memory normal.        Judgment: Judgment normal.      No results found for any visits on 06/30/24.    The 10-year ASCVD risk score (Arnett DK, et al., 2019) is: 4.9%  Latest Reference Range & Units 12/09/23 15:22 12/09/23 15:36  12/17/23 15:20  Comprehensive metabolic panel with GFR  Rpt !    Sodium 135 - 145 mEq/L 133 (L)  137  Potassium 3.5 - 5.1 mEq/L 4.8  4.2  Chloride 96 - 112 mEq/L 100  102  CO2 19 - 32 mEq/L 28  25  Glucose 70 - 99 mg/dL 885 (H)  78  BUN 6 - 23 mg/dL 17  15  Creatinine 9.59 - 1.50 mg/dL 9.22  9.09  Calcium  8.4 - 10.5 mg/dL 9.5  9.6  Alkaline Phosphatase 39 - 117 U/L 51    Albumin 3.5 - 5.2 g/dL 4.8    AST 0 - 37 U/L 18    ALT 0 - 53 U/L 19    Total Protein 6.0 - 8.3 g/dL 7.4    Total Bilirubin 0.2 - 1.2 mg/dL 0.8    GFR >39.99 mL/min 105.03  100.18  CK Total 7 - 232 U/L  145   Total CHOL/HDL Ratio  3    Cholesterol 0 - 200 mg/dL 812    HDL Cholesterol >39.00 mg/dL 35.29    LDL (calc) 0 - 99 mg/dL 889 (H)    NonHDL  877.33    Triglycerides 0.0 - 149.0 mg/dL 36.9    VLDL 0.0 - 59.9 mg/dL 87.3    VITD 69.99 - 899.99 ng/mL 31.49    Hemoglobin A1C 4.6 - 6.5 % 5.8    PSA 0.10 - 4.00 ng/mL 3.50    !: Data is abnormal (L): Data is abnormally low (H): Data is abnormally high Rpt: View report in Results Review for more information   Assessment & Plan:   Problem List Items Addressed This Visit       Cardiovascular and Mediastinum   Essential hypertension - Primary   Relevant Medications   losartan  (COZAAR ) 25 MG tablet   Other Visit Diagnoses       Benign hypertension       Relevant Medications   losartan  (COZAAR ) 25 MG tablet      Patient has been chronically on losartan  25mg  po daily and hydrochlorothiazide  12.5mg  po daily He fills hydrochlorothiazide  through [facility] at [facility] [employer].  Last PCM appt 12/09/23 BP 128/76 HR 97 labs stable electronic Rx sent to his pharmacy of choice losartan  25mg  po daily #90 RF0 Patient notified electronic Rx sent today to his pharmacy of choice [facility] to pick up today.  Patient verbalized understanding information/instructions and had  no further questions at this time. Return if symptoms worsen or fail to improve.    Ellouise DELENA Hope, NP

## 2024-07-06 ENCOUNTER — Telehealth: Payer: Self-pay | Admitting: Registered Nurse

## 2024-07-06 NOTE — Telephone Encounter (Signed)
 Patient requested new losartan  Rx as was unable to pick up Rx from pharmacy last week . Discussed with patient Rx still active but he has to speak with pharmacy staff to notify them he wants Rx to be filled this week.  Rx was transmitted 06/30/24 to his pharmacy of choice and transmittal receipt received by epic.  Patient verbalized understanding information and had no further questions at this time.

## 2024-08-19 ENCOUNTER — Encounter: Payer: Self-pay | Admitting: Family Medicine

## 2024-08-19 ENCOUNTER — Ambulatory Visit: Admitting: Family Medicine

## 2024-08-19 VITALS — BP 126/82 | HR 71 | Temp 98.7°F | Resp 12 | Ht 67.0 in | Wt 200.6 lb

## 2024-08-19 DIAGNOSIS — E7800 Pure hypercholesterolemia, unspecified: Secondary | ICD-10-CM | POA: Diagnosis not present

## 2024-08-19 DIAGNOSIS — I10 Essential (primary) hypertension: Secondary | ICD-10-CM

## 2024-08-19 DIAGNOSIS — E559 Vitamin D deficiency, unspecified: Secondary | ICD-10-CM

## 2024-08-19 DIAGNOSIS — R7303 Prediabetes: Secondary | ICD-10-CM

## 2024-08-19 DIAGNOSIS — Z23 Encounter for immunization: Secondary | ICD-10-CM | POA: Diagnosis not present

## 2024-08-19 LAB — LIPID PANEL
Cholesterol: 174 mg/dL (ref 28–200)
HDL: 63.9 mg/dL
LDL Cholesterol: 98 mg/dL (ref 10–99)
NonHDL: 110.59
Total CHOL/HDL Ratio: 3
Triglycerides: 64 mg/dL (ref 10.0–149.0)
VLDL: 12.8 mg/dL (ref 0.0–40.0)

## 2024-08-19 LAB — VITAMIN D 25 HYDROXY (VIT D DEFICIENCY, FRACTURES): VITD: 41.12 ng/mL (ref 30.00–100.00)

## 2024-08-19 LAB — MICROALBUMIN / CREATININE URINE RATIO
Creatinine,U: 28.1 mg/dL
Microalb Creat Ratio: UNDETERMINED mg/g (ref 0.0–30.0)
Microalb, Ur: <0.7 mg/dL

## 2024-08-19 LAB — COMPREHENSIVE METABOLIC PANEL WITH GFR
ALT: 29 U/L (ref 3–53)
AST: 22 U/L (ref 5–37)
Albumin: 4.6 g/dL (ref 3.5–5.2)
Alkaline Phosphatase: 48 U/L (ref 39–117)
BUN: 17 mg/dL (ref 6–23)
CO2: 28 meq/L (ref 19–32)
Calcium: 9.6 mg/dL (ref 8.4–10.5)
Chloride: 105 meq/L (ref 96–112)
Creatinine, Ser: 0.8 mg/dL (ref 0.40–1.50)
GFR: 103.32 mL/min
Glucose, Bld: 104 mg/dL — ABNORMAL HIGH (ref 70–99)
Potassium: 4.4 meq/L (ref 3.5–5.1)
Sodium: 139 meq/L (ref 135–145)
Total Bilirubin: 0.5 mg/dL (ref 0.2–1.2)
Total Protein: 7.1 g/dL (ref 6.0–8.3)

## 2024-08-19 LAB — HEMOGLOBIN A1C: Hgb A1c MFr Bld: 5.7 % (ref 4.6–6.5)

## 2024-08-19 NOTE — Patient Instructions (Addendum)
 Try taking the atorvastatin  a few days more per week, and if tolerated can take daily.  Thank you for coming in today. No other change in medications at this time. If there are any concerns on your bloodwork, I will let you know.  First shingles vaccine given today, can repeat at your next visit in 6 months.  Pneumonia vaccine given today.  Take care!

## 2024-08-19 NOTE — Progress Notes (Signed)
 "  Subjective:  Patient ID: Adrian Alvarez, male    DOB: May 03, 1976  Age: 49 y.o. MRN: [id number]  CC:  Chief Complaint  Patient presents with   Follow-up    6 month follow up. No questions or concerns.     HPI [name] presents for   Hypertension: Hydrochlorothiazide  12.5 mg daily, losartan  25 mg daily.  No new side effects, borderline hyponatremia of 133 in May 2025, normalized on repeat Home readings: none BP Readings from Last 3 Encounters:  08/19/24 126/82  06/07/24 121/81  12/09/23 128/76   Lab Results  Component Value Date   CREATININE 0.90 12/17/2023   Hyperlipidemia: Lipitor 20 mg 1/2 pill 2 times per week when discussed at his May visit. No side effects. Trouble remembering to take at times.  Lab Results  Component Value Date   CHOL 187 12/09/2023   HDL 64.70 12/09/2023   LDLCALC 110 (H) 12/09/2023   TRIG 63.0 12/09/2023   CHOLHDL 3 12/09/2023   Lab Results  Component Value Date   ALT 19 12/09/2023   AST 18 12/09/2023   GGT 65 11/29/2020   ALKPHOS 51 12/09/2023   BILITOT 0.8 12/09/2023   Prediabetes: Levels have improved previously with diet changes, weight loss.  Weight has increased a few pounds since last visit. Exercising, but some decrease in past month. Plans to increase.  Still trying to watch diet.  Lab Results  Component Value Date   HGBA1C 5.8 12/09/2023   Wt Readings from Last 3 Encounters:  08/19/24 200 lb 9.6 oz (91 kg)  10/02/23 196 lb (88.9 kg)  09/07/23 196 lb (88.9 kg)   Vitamin D  deficiency Treated with over-the-counter supplementation. Still taking. Occasional magnesium and omega 3.  Last vitamin D  Lab Results  Component Value Date   VD25OH 31.49 12/09/2023   Health maintenance: Shingles vaccine #1, pneumonia vaccine ordered today.  Immunization History  Administered Date(s) Administered   Influenza, Seasonal, Injecte, Preservative Fre 05/14/2023   Influenza,inj,Quad PF,6+ Mos 05/16/2016, 05/08/2017, 05/02/2019,  05/03/2020, 05/09/2021, 05/05/2024   Influenza-Unspecified 05/02/2020, 03/21/2022   PFIZER(Purple Top)SARS-COV-2 Vaccination 10/13/2019, 11/04/2019, 06/02/2020   Pfizer(Comirnaty )Fall Seasonal Vaccine 12 years and older 08/08/2022, 05/05/2024   Tdap 10/06/2022     History Patient Active Problem List   Diagnosis Date Noted   Essential hypertension 04/16/2022   Pre-diabetes 01/23/2021   Past Medical History:  Diagnosis Date   Diabetes (HCC)    Hyperlipidemia    Hypertension    Pre-diabetes    Tuberculosis 2000   Past Surgical History:  Procedure Laterality Date   THORACENTESIS  2000   Allergies[1] Prior to Admission medications  Medication Sig Start Date End Date Taking? Authorizing Provider  atorvastatin  (LIPITOR) 20 MG tablet Take 1/2 tablet by mouth twice a week 11/12/23  Yes Betancourt, Tina A, NP  hydrochlorothiazide  (HYDRODIURIL ) 12.5 MG tablet Take 1 tablet (12.5 mg total) by mouth daily. 06/07/24 12/04/24 Yes Betancourt, Ellouise LABOR, NP  ibuprofen (ADVIL) 200 MG tablet Take 200 mg by mouth every 6 (six) hours as needed for mild pain (pain score 1-3) or moderate pain (pain score 4-6).   Yes [provider]  losartan  (COZAAR ) 25 MG tablet Take 1 tablet (25 mg total) by mouth every morning. 06/30/24  Yes Betancourt, Ellouise LABOR, NP   Social History   Socioeconomic History   Marital status: Married    Spouse name: Isabelle   Number of children: 1   Years of education: Not on file   Highest education level: Not  on file  Occupational History   Not on file  Tobacco Use   Smoking status: Never   Smokeless tobacco: Never  Vaping Use   Vaping status: Never Used  Substance and Sexual Activity   Alcohol use: Yes    Alcohol/week: 6.0 standard drinks of alcohol    Types: 6 Standard drinks or equivalent per week    Comment: occ   Drug use: No   Sexual activity: Not on file  Other Topics Concern   Not on file  Social History Narrative   From the [place]   Social  Drivers of Health   Tobacco Use: Low Risk (08/19/2024)   Patient History    Smoking Tobacco Use: Never    Smokeless Tobacco Use: Never    Passive Exposure: Not on file  Financial Resource Strain: Low Risk (12/09/2023)   Overall Financial Resource Strain (CARDIA)    Difficulty of Paying Living Expenses: Not hard at all  Food Insecurity: Not on file  Transportation Needs: Not on file  Physical Activity: Sufficiently Active (12/09/2023)   Exercise Vital Sign    Days of Exercise per Week: 4 days    Minutes of Exercise per Session: 90 min  Stress: No Stress Concern Present (12/09/2023)   [employer] of Occupational Health - Occupational Stress Questionnaire    Feeling of Stress : Not at all  Social Connections: Moderately Integrated (12/09/2023)   Social Connection and Isolation Panel    Frequency of Communication with Friends and Family: More than three times a week    Frequency of Social Gatherings with Friends and Family: Once a week    Attends Religious Services: More than 4 times per year    Active Member of [employer] or Organizations: No    Attends Banker Meetings: Never    Marital Status: Married  Catering Manager Violence: Not on file  Depression (PHQ2-9): Low Risk (12/09/2023)   Depression (PHQ2-9)    PHQ-2 Score: 0  Alcohol Screen: Low Risk (12/09/2023)   Alcohol Screen    Last Alcohol Screening Score (AUDIT): 1  Housing: Not on file  Utilities: Not on file  Health Literacy: Adequate Health Literacy (12/09/2023)   B1300 Health Literacy    Frequency of need for help with medical instructions: Never    Review of Systems  Constitutional:  Negative for fatigue and unexpected weight change.  Eyes:  Negative for visual disturbance.  Respiratory:  Negative for cough, chest tightness and shortness of breath.   Cardiovascular:  Negative for chest pain, palpitations and leg swelling.  Gastrointestinal:  Negative for abdominal pain and blood in stool.  Neurological:   Negative for dizziness, light-headedness and headaches.     Objective:   Vitals:   08/19/24 0827  BP: 126/82  Pulse: 71  Resp: 12  Temp: 98.7 F (37.1 C)  TempSrc: Temporal  SpO2: 99%  Weight: 200 lb 9.6 oz (91 kg)  Height: 5' 7 (1.702 m)     Physical Exam Vitals reviewed.  Constitutional:      Appearance: He is well-developed.  HENT:     Head: Normocephalic and atraumatic.  Neck:     Vascular: No carotid bruit or JVD.  Cardiovascular:     Rate and Rhythm: Normal rate and regular rhythm.     Heart sounds: Normal heart sounds. No murmur heard. Pulmonary:     Effort: Pulmonary effort is normal.     Breath sounds: Normal breath sounds. No rales.  Musculoskeletal:     Right  lower leg: No edema.     Left lower leg: No edema.  Skin:    General: Skin is warm and dry.  Neurological:     Mental Status: He is alert and oriented to person, place, and time.  Psychiatric:        Mood and Affect: Mood normal.        Assessment & Plan:  Adrian Alvarez is a 49 y.o. male . Benign hypertension  - Stable control, continue same med regimen.  Has also been followed by medical provider at work.  Prior borderline hyponatremia, check updated labs.  Adjust plan accordingly  Elevated LDL cholesterol level - Plan: Comprehensive metabolic panel with GFR, Lipid panel  - Would like to see LDL lower than 70, check updated labs.  Recommended trying atorvastatin  a few more days per week with goal of daily if tolerated.  Okay to stay at half dosing for now.  Vitamin D  deficiency - Plan: Vitamin D  (25 hydroxy)  - Over-the-counter supplementation as above, check updated levels and adjust plan accordingly  Prediabetes - Plan: Urine Albumin/Creatinine with ratio (send out) [LAB689], Comprehensive metabolic panel with GFR, Hemoglobin A1c  - Weight has slightly increased, plans to increase exercise.  Check labs and monitor for now.  Need for shingles vaccine - Plan: Varicella-zoster vaccine  IM  - First dose today, repeat 6 months.  Need for pneumococcal vaccination - Plan: Pneumococcal conjugate vaccine 20-valent   No orders of the defined types were placed in this encounter.  Patient Instructions  Try taking the atorvastatin  a few days more per week, and if tolerated can take daily.  Thank you for coming in today. No other change in medications at this time. If there are any concerns on your bloodwork, I will let you know.  First shingles vaccine given today, can repeat at your next visit in 6 months.  Pneumonia vaccine given today.  Take care!      Signed,   Reyes Pines, MD [facility], [facility] 08/19/24 8:53 AM      [1]  Allergies Allergen Reactions   Mucinex Clear & Cool Day-Night Itching   "

## 2024-08-21 ENCOUNTER — Ambulatory Visit: Payer: Self-pay | Admitting: Family Medicine

## 2025-03-03 ENCOUNTER — Encounter: Admitting: Family Medicine
